# Patient Record
Sex: Male | Born: 1935 | Race: Black or African American | Hispanic: No | Marital: Married | Smoking: Never smoker
Health system: Southern US, Community
[De-identification: ages and names within clinical notes are randomized; demographics above are authoritative.]

## PROBLEM LIST (undated history)

## (undated) DIAGNOSIS — I10 Essential (primary) hypertension: Secondary | ICD-10-CM

## (undated) DIAGNOSIS — I619 Nontraumatic intracerebral hemorrhage, unspecified: Secondary | ICD-10-CM

## (undated) DIAGNOSIS — I69351 Hemiplegia and hemiparesis following cerebral infarction affecting right dominant side: Secondary | ICD-10-CM

## (undated) DIAGNOSIS — E1159 Type 2 diabetes mellitus with other circulatory complications: Secondary | ICD-10-CM

## (undated) DIAGNOSIS — I69220 Aphasia following other nontraumatic intracranial hemorrhage: Secondary | ICD-10-CM

## (undated) DIAGNOSIS — F801 Expressive language disorder: Secondary | ICD-10-CM

## (undated) HISTORY — DX: Nontraumatic intracerebral hemorrhage, unspecified: I61.9

## (undated) HISTORY — DX: Essential (primary) hypertension: I10

## (undated) HISTORY — DX: Expressive language disorder: F80.1

## (undated) HISTORY — DX: Hemiplegia and hemiparesis following cerebral infarction affecting right dominant side: I69.351

## (undated) HISTORY — DX: Type 2 diabetes mellitus with other circulatory complications: E11.59

## (undated) HISTORY — DX: Aphasia following other nontraumatic intracranial hemorrhage: I69.220

## (undated) HISTORY — PX: OTHER SURGICAL HISTORY: SHX169

---

## 2000-09-29 ENCOUNTER — Ambulatory Visit (HOSPITAL_COMMUNITY): Admission: RE | Admit: 2000-09-29 | Discharge: 2000-09-29 | Payer: Self-pay

## 2000-10-28 ENCOUNTER — Ambulatory Visit (HOSPITAL_COMMUNITY): Admission: RE | Admit: 2000-10-28 | Discharge: 2000-10-28 | Payer: Self-pay

## 2001-01-08 ENCOUNTER — Encounter: Payer: Self-pay | Admitting: Urology

## 2001-01-08 ENCOUNTER — Ambulatory Visit (HOSPITAL_COMMUNITY): Admission: RE | Admit: 2001-01-08 | Discharge: 2001-01-08 | Payer: Self-pay | Admitting: Urology

## 2001-08-03 ENCOUNTER — Ambulatory Visit (HOSPITAL_COMMUNITY): Admission: RE | Admit: 2001-08-03 | Discharge: 2001-08-03 | Payer: Self-pay | Admitting: Urology

## 2001-08-03 ENCOUNTER — Encounter: Payer: Self-pay | Admitting: Urology

## 2003-08-02 ENCOUNTER — Ambulatory Visit (HOSPITAL_COMMUNITY): Admission: RE | Admit: 2003-08-02 | Discharge: 2003-08-02 | Payer: Self-pay | Admitting: General Surgery

## 2008-04-15 ENCOUNTER — Ambulatory Visit (HOSPITAL_COMMUNITY): Admission: RE | Admit: 2008-04-15 | Discharge: 2008-04-15 | Payer: Self-pay | Admitting: Urology

## 2008-11-10 ENCOUNTER — Ambulatory Visit (HOSPITAL_COMMUNITY): Admission: RE | Admit: 2008-11-10 | Discharge: 2008-11-11 | Payer: Self-pay | Admitting: Ophthalmology

## 2008-11-11 ENCOUNTER — Emergency Department (HOSPITAL_COMMUNITY): Admission: EM | Admit: 2008-11-11 | Discharge: 2008-11-11 | Payer: Self-pay | Admitting: Emergency Medicine

## 2008-11-13 ENCOUNTER — Emergency Department (HOSPITAL_COMMUNITY): Admission: EM | Admit: 2008-11-13 | Discharge: 2008-11-13 | Payer: Self-pay | Admitting: Emergency Medicine

## 2009-03-17 ENCOUNTER — Ambulatory Visit (HOSPITAL_COMMUNITY): Admission: RE | Admit: 2009-03-17 | Discharge: 2009-03-17 | Payer: Self-pay | Admitting: General Surgery

## 2010-08-15 LAB — GLUCOSE, CAPILLARY: Glucose-Capillary: 140 mg/dL — ABNORMAL HIGH (ref 70–99)

## 2010-08-15 LAB — BASIC METABOLIC PANEL
BUN: 14 mg/dL (ref 6–23)
CO2: 33 mEq/L — ABNORMAL HIGH (ref 19–32)
Chloride: 103 mEq/L (ref 96–112)
Creatinine, Ser: 1.2 mg/dL (ref 0.4–1.5)
Glucose, Bld: 130 mg/dL — ABNORMAL HIGH (ref 70–99)

## 2010-08-15 LAB — CBC
MCHC: 33.3 g/dL (ref 30.0–36.0)
MCV: 76 fL — ABNORMAL LOW (ref 78.0–100.0)
Platelets: 144 10*3/uL — ABNORMAL LOW (ref 150–400)
RDW: 14.4 % (ref 11.5–15.5)
WBC: 3.4 10*3/uL — ABNORMAL LOW (ref 4.0–10.5)

## 2010-08-19 LAB — BASIC METABOLIC PANEL
BUN: 10 mg/dL (ref 6–23)
Calcium: 9.3 mg/dL (ref 8.4–10.5)
Creatinine, Ser: 1.33 mg/dL (ref 0.4–1.5)
GFR calc non Af Amer: 52 mL/min — ABNORMAL LOW (ref >60)

## 2010-08-19 LAB — CBC
Platelets: 155 10*3/uL (ref 150–400)
RDW: 14.6 % (ref 11.5–15.5)
WBC: 3.3 10*3/uL — ABNORMAL LOW (ref 4.0–10.5)

## 2010-08-20 LAB — URINALYSIS, ROUTINE W REFLEX MICROSCOPIC
Leukocytes, UA: NEGATIVE
Nitrite: NEGATIVE
Specific Gravity, Urine: <1.005 — ABNORMAL LOW (ref 1.005–1.030)
Urobilinogen, UA: 0.2 mg/dL (ref 0.0–1.0)
pH: 7 (ref 5.0–8.0)

## 2010-08-20 LAB — URINE CULTURE: Colony Count: 25000

## 2010-08-20 LAB — URINE MICROSCOPIC-ADD ON

## 2010-09-25 NOTE — Op Note (Signed)
NAME:  Ryan Adams, Ryan Adams                ACCOUNT NO.:  [id number]   MEDICAL RECORD NO.:  [id number]          PATIENT TYPE:  AMB   LOCATION:  SDS                          FACILITY:  [facility]   PHYSICIAN:  John D. Zigmund Daniel, M.D. DATE OF BIRTH:  [birth year]   DATE OF PROCEDURE:  11/10/2008  DATE OF DISCHARGE:                               OPERATIVE REPORT   ADMISSION DIAGNOSIS:  Rhegmatogenous retinal detachment, right eye.   PROCEDURES:  1. Scleral buckle, right eye.  2. Gas fluid exchange, right eye.   SURGEON:  Chrystie Nose. Zigmund Daniel, M.D.   ASSISTANT:  Clancy Gourd, RN.   ANESTHESIA:  General.   DETAILS:  Usual prep and drape, 360-degree limbal peritomy.  Isolation  of 4 rectus muscles on 2-0 silk.  Localization of break at 6 o'clock.  Scleral dissection for 360 degrees to admit #279 intrascleral implant.  Diathermy placed in the bed.  Additional posterior dissection carried  out at 6 o'clock beneath the break.  A 279 implant placed around the  globe with a joint at 2 o'clock.  A 240 band was placed around the globe  with a 270 sleeve at 2 o'clock.  Perforation site was chosen at 10  o'clock.  A large amount of clear colorless subretinal fluid came forth.  Perfluoropropane 50%, 2 mL was injected into the vitreous cavity to  reattach the retina.  Additional fluid egressed at that time.  The  buckle was adjusted and trimmed.  The band was adjusted and trimmed.  The sutures were knotted, and the free ends removed.  Indirect  ophthalmoscopy showed the retina to be lying nicely on the scleral  buckle with no subretinal fluid remaining.  The sutures were knotted and  the free ends removed.  The conjunctiva was reposited with 7-0 chromic  suture.  Polymyxin and gentamicin were irrigated at [place] space.  Atropine solution was applied.  Decadron 10 mg was injected into the  lower subconjunctival space.  Marcaine was injected around the globe for  postop pain.  Closing pressure was 15 with a [place]  tonometer.  Polysporin ophthalmic ointment, a patch, and shield were placed.  The  patient was awakened and taken to the recovery in satisfactory  condition.   DURATION:  2 hours.   COMPLICATIONS:  None.      Chrystie Nose. Zigmund Daniel, M.D.  Electronically Signed     JDM/MEDQ  D:  11/10/2008  T:  11/11/2008  Job:  [id number]

## 2010-09-28 NOTE — H&P (Signed)
NAME:  Whitley, Zarling NO.:  [id number]   MEDICAL RECORD NO.:  [id number]                  PATIENT TYPE:   LOCATION:                                       FACILITY:   PHYSICIAN:  Jamesetta So, M.D.               DATE OF BIRTH:  10-27-1927   DATE OF ADMISSION:  08/02/2003  DATE OF DISCHARGE:                                HISTORY & PHYSICAL   CHIEF COMPLAINT:  Hematochezia.   HISTORY OF PRESENT ILLNESS:  The patient is a 77 year old black male who is  referred for endoscopic evaluation.  He needs a colonoscopy for  hematochezia.  He last had a colonoscopy over 10 years ago.  There is no  family history of colon carcinoma.  He has had intermittent hematochezia  with bowel movements.  No history of abdominal pain, weight loss, nausea,  vomiting, diarrhea, or melena have been noted.   PAST MEDICAL HISTORY:  Includes hypertension.   PAST SURGICAL HISTORY:  Left inguinal herniorrhaphy in 2001.   CURRENT MEDICATIONS:  A blood pressure pill, nerve pill, baby aspirin,  Prevacid, a fluid pill.   ALLERGIES:  No known drug allergies.   REVIEW OF SYSTEMS:  Noncontributory.   PHYSICAL EXAMINATION:  GENERAL:  The patient is a well-developed, well-  nourished black male in no acute distress.  VITAL SIGNS:  He is afebrile and vital signs are stable.  LUNGS:  Clear to auscultation with equal breath sounds bilaterally.  HEART:  Reveals a regular rate and rhythm without S3, S4, or murmurs.  ABDOMEN:  Soft, nontender, nondistended.  No hepatosplenomegaly or masses  are noted.  RECTAL:  Deferred to the procedure.   IMPRESSION:  Hematochezia.   PLAN:  The patient is scheduled for a colonoscopy on August 02, 2003.  The  risks and benefits of the procedure including bleeding and perforation were  fully explained to the patient who gave informed consent.     ___________________________________________                                         Jamesetta So,  M.D.   MAJ/MEDQ  D:  07/19/2003  T:  07/19/2003  Job:  [id number]   cc:   Monico Blitz, MD  [place]  [place] [address]  Fax: [phone]

## 2014-08-19 ENCOUNTER — Encounter (INDEPENDENT_AMBULATORY_CARE_PROVIDER_SITE_OTHER): Payer: Medicare Other | Admitting: Ophthalmology

## 2014-08-19 DIAGNOSIS — H338 Other retinal detachments: Secondary | ICD-10-CM

## 2014-08-19 DIAGNOSIS — E11329 Type 2 diabetes mellitus with mild nonproliferative diabetic retinopathy without macular edema: Secondary | ICD-10-CM

## 2014-08-19 DIAGNOSIS — I10 Essential (primary) hypertension: Secondary | ICD-10-CM

## 2014-08-19 DIAGNOSIS — H35033 Hypertensive retinopathy, bilateral: Secondary | ICD-10-CM

## 2014-08-19 DIAGNOSIS — H43813 Vitreous degeneration, bilateral: Secondary | ICD-10-CM

## 2014-08-19 DIAGNOSIS — E11319 Type 2 diabetes mellitus with unspecified diabetic retinopathy without macular edema: Secondary | ICD-10-CM

## 2014-08-22 NOTE — Progress Notes (Signed)
Spoke with both pt and Dr. Zigmund Daniel, pt surgery is cancelled.

## 2014-08-23 ENCOUNTER — Encounter (HOSPITAL_COMMUNITY): Admission: RE | Payer: Self-pay | Source: Ambulatory Visit

## 2014-08-23 ENCOUNTER — Ambulatory Visit (HOSPITAL_COMMUNITY): Admission: RE | Admit: 2014-08-23 | Payer: Self-pay | Source: Ambulatory Visit | Admitting: Ophthalmology

## 2014-08-23 SURGERY — SCLERAL BUCKLE
Anesthesia: General | Laterality: Right

## 2015-07-06 DIAGNOSIS — E1165 Type 2 diabetes mellitus with hyperglycemia: Secondary | ICD-10-CM | POA: Diagnosis not present

## 2015-07-06 DIAGNOSIS — I10 Essential (primary) hypertension: Secondary | ICD-10-CM | POA: Diagnosis not present

## 2015-07-06 DIAGNOSIS — Z0000 Encounter for general adult medical examination without abnormal findings: Secondary | ICD-10-CM | POA: Diagnosis not present

## 2015-10-03 DIAGNOSIS — E1165 Type 2 diabetes mellitus with hyperglycemia: Secondary | ICD-10-CM | POA: Diagnosis not present

## 2015-10-03 DIAGNOSIS — I10 Essential (primary) hypertension: Secondary | ICD-10-CM | POA: Diagnosis not present

## 2015-10-03 DIAGNOSIS — L600 Ingrowing nail: Secondary | ICD-10-CM | POA: Diagnosis not present

## 2015-11-21 DIAGNOSIS — E119 Type 2 diabetes mellitus without complications: Secondary | ICD-10-CM | POA: Diagnosis not present

## 2016-01-02 DIAGNOSIS — Z681 Body mass index (BMI) 19 or less, adult: Secondary | ICD-10-CM | POA: Diagnosis not present

## 2016-01-02 DIAGNOSIS — E1165 Type 2 diabetes mellitus with hyperglycemia: Secondary | ICD-10-CM | POA: Diagnosis not present

## 2016-01-02 DIAGNOSIS — K210 Gastro-esophageal reflux disease with esophagitis: Secondary | ICD-10-CM | POA: Diagnosis not present

## 2016-01-02 DIAGNOSIS — Z1389 Encounter for screening for other disorder: Secondary | ICD-10-CM | POA: Diagnosis not present

## 2016-01-02 DIAGNOSIS — I10 Essential (primary) hypertension: Secondary | ICD-10-CM | POA: Diagnosis not present

## 2016-01-02 DIAGNOSIS — Z0000 Encounter for general adult medical examination without abnormal findings: Secondary | ICD-10-CM | POA: Diagnosis not present

## 2016-01-02 DIAGNOSIS — N401 Enlarged prostate with lower urinary tract symptoms: Secondary | ICD-10-CM | POA: Diagnosis not present

## 2016-01-16 DIAGNOSIS — E1165 Type 2 diabetes mellitus with hyperglycemia: Secondary | ICD-10-CM | POA: Diagnosis not present

## 2016-01-16 DIAGNOSIS — K210 Gastro-esophageal reflux disease with esophagitis: Secondary | ICD-10-CM | POA: Diagnosis not present

## 2016-01-16 DIAGNOSIS — I10 Essential (primary) hypertension: Secondary | ICD-10-CM | POA: Diagnosis not present

## 2016-02-13 DIAGNOSIS — Z23 Encounter for immunization: Secondary | ICD-10-CM | POA: Diagnosis not present

## 2016-02-14 DIAGNOSIS — M85851 Other specified disorders of bone density and structure, right thigh: Secondary | ICD-10-CM | POA: Diagnosis not present

## 2016-02-14 DIAGNOSIS — Z87891 Personal history of nicotine dependence: Secondary | ICD-10-CM | POA: Diagnosis not present

## 2016-02-14 DIAGNOSIS — Z79899 Other long term (current) drug therapy: Secondary | ICD-10-CM | POA: Diagnosis not present

## 2016-02-14 DIAGNOSIS — M810 Age-related osteoporosis without current pathological fracture: Secondary | ICD-10-CM | POA: Diagnosis not present

## 2016-02-14 DIAGNOSIS — E119 Type 2 diabetes mellitus without complications: Secondary | ICD-10-CM | POA: Diagnosis not present

## 2016-02-14 DIAGNOSIS — K219 Gastro-esophageal reflux disease without esophagitis: Secondary | ICD-10-CM | POA: Diagnosis not present

## 2016-02-14 DIAGNOSIS — M85852 Other specified disorders of bone density and structure, left thigh: Secondary | ICD-10-CM | POA: Diagnosis not present

## 2016-02-14 DIAGNOSIS — M1990 Unspecified osteoarthritis, unspecified site: Secondary | ICD-10-CM | POA: Diagnosis not present

## 2016-02-14 DIAGNOSIS — I10 Essential (primary) hypertension: Secondary | ICD-10-CM | POA: Diagnosis not present

## 2016-02-14 DIAGNOSIS — Z7984 Long term (current) use of oral hypoglycemic drugs: Secondary | ICD-10-CM | POA: Diagnosis not present

## 2016-02-14 DIAGNOSIS — M85862 Other specified disorders of bone density and structure, left lower leg: Secondary | ICD-10-CM | POA: Diagnosis not present

## 2016-03-15 DIAGNOSIS — E1165 Type 2 diabetes mellitus with hyperglycemia: Secondary | ICD-10-CM | POA: Diagnosis not present

## 2016-03-15 DIAGNOSIS — K210 Gastro-esophageal reflux disease with esophagitis: Secondary | ICD-10-CM | POA: Diagnosis not present

## 2016-03-15 DIAGNOSIS — I10 Essential (primary) hypertension: Secondary | ICD-10-CM | POA: Diagnosis not present

## 2016-04-16 DIAGNOSIS — K210 Gastro-esophageal reflux disease with esophagitis: Secondary | ICD-10-CM | POA: Diagnosis not present

## 2016-04-16 DIAGNOSIS — E1165 Type 2 diabetes mellitus with hyperglycemia: Secondary | ICD-10-CM | POA: Diagnosis not present

## 2016-04-16 DIAGNOSIS — I10 Essential (primary) hypertension: Secondary | ICD-10-CM | POA: Diagnosis not present

## 2016-04-16 DIAGNOSIS — N401 Enlarged prostate with lower urinary tract symptoms: Secondary | ICD-10-CM | POA: Diagnosis not present

## 2016-05-28 DIAGNOSIS — K210 Gastro-esophageal reflux disease with esophagitis: Secondary | ICD-10-CM | POA: Diagnosis not present

## 2016-05-28 DIAGNOSIS — I10 Essential (primary) hypertension: Secondary | ICD-10-CM | POA: Diagnosis not present

## 2016-05-28 DIAGNOSIS — N401 Enlarged prostate with lower urinary tract symptoms: Secondary | ICD-10-CM | POA: Diagnosis not present

## 2016-05-28 DIAGNOSIS — E1165 Type 2 diabetes mellitus with hyperglycemia: Secondary | ICD-10-CM | POA: Diagnosis not present

## 2016-07-16 DIAGNOSIS — E1165 Type 2 diabetes mellitus with hyperglycemia: Secondary | ICD-10-CM | POA: Diagnosis not present

## 2016-07-16 DIAGNOSIS — Z0000 Encounter for general adult medical examination without abnormal findings: Secondary | ICD-10-CM | POA: Diagnosis not present

## 2016-07-16 DIAGNOSIS — N401 Enlarged prostate with lower urinary tract symptoms: Secondary | ICD-10-CM | POA: Diagnosis not present

## 2016-07-16 DIAGNOSIS — K210 Gastro-esophageal reflux disease with esophagitis: Secondary | ICD-10-CM | POA: Diagnosis not present

## 2016-07-16 DIAGNOSIS — I10 Essential (primary) hypertension: Secondary | ICD-10-CM | POA: Diagnosis not present

## 2016-10-17 DIAGNOSIS — K210 Gastro-esophageal reflux disease with esophagitis: Secondary | ICD-10-CM | POA: Diagnosis not present

## 2016-10-17 DIAGNOSIS — N401 Enlarged prostate with lower urinary tract symptoms: Secondary | ICD-10-CM | POA: Diagnosis not present

## 2016-10-17 DIAGNOSIS — I10 Essential (primary) hypertension: Secondary | ICD-10-CM | POA: Diagnosis not present

## 2016-10-17 DIAGNOSIS — E1165 Type 2 diabetes mellitus with hyperglycemia: Secondary | ICD-10-CM | POA: Diagnosis not present

## 2017-01-15 DIAGNOSIS — K210 Gastro-esophageal reflux disease with esophagitis: Secondary | ICD-10-CM | POA: Diagnosis not present

## 2017-01-15 DIAGNOSIS — E119 Type 2 diabetes mellitus without complications: Secondary | ICD-10-CM | POA: Diagnosis not present

## 2017-01-15 DIAGNOSIS — I10 Essential (primary) hypertension: Secondary | ICD-10-CM | POA: Diagnosis not present

## 2017-01-15 DIAGNOSIS — N401 Enlarged prostate with lower urinary tract symptoms: Secondary | ICD-10-CM | POA: Diagnosis not present

## 2017-02-04 DIAGNOSIS — K210 Gastro-esophageal reflux disease with esophagitis: Secondary | ICD-10-CM | POA: Diagnosis not present

## 2017-02-04 DIAGNOSIS — I10 Essential (primary) hypertension: Secondary | ICD-10-CM | POA: Diagnosis not present

## 2017-02-04 DIAGNOSIS — E1165 Type 2 diabetes mellitus with hyperglycemia: Secondary | ICD-10-CM | POA: Diagnosis not present

## 2017-02-11 DIAGNOSIS — Z23 Encounter for immunization: Secondary | ICD-10-CM | POA: Diagnosis not present

## 2017-03-11 DIAGNOSIS — E1165 Type 2 diabetes mellitus with hyperglycemia: Secondary | ICD-10-CM | POA: Diagnosis not present

## 2017-03-11 DIAGNOSIS — K210 Gastro-esophageal reflux disease with esophagitis: Secondary | ICD-10-CM | POA: Diagnosis not present

## 2017-03-11 DIAGNOSIS — I10 Essential (primary) hypertension: Secondary | ICD-10-CM | POA: Diagnosis not present

## 2017-04-11 DIAGNOSIS — E1165 Type 2 diabetes mellitus with hyperglycemia: Secondary | ICD-10-CM | POA: Diagnosis not present

## 2017-04-11 DIAGNOSIS — K210 Gastro-esophageal reflux disease with esophagitis: Secondary | ICD-10-CM | POA: Diagnosis not present

## 2017-04-11 DIAGNOSIS — I10 Essential (primary) hypertension: Secondary | ICD-10-CM | POA: Diagnosis not present

## 2017-04-15 DIAGNOSIS — N401 Enlarged prostate with lower urinary tract symptoms: Secondary | ICD-10-CM | POA: Diagnosis not present

## 2017-04-15 DIAGNOSIS — Z125 Encounter for screening for malignant neoplasm of prostate: Secondary | ICD-10-CM | POA: Diagnosis not present

## 2017-04-15 DIAGNOSIS — E119 Type 2 diabetes mellitus without complications: Secondary | ICD-10-CM | POA: Diagnosis not present

## 2017-04-15 DIAGNOSIS — K210 Gastro-esophageal reflux disease with esophagitis: Secondary | ICD-10-CM | POA: Diagnosis not present

## 2017-04-15 DIAGNOSIS — I10 Essential (primary) hypertension: Secondary | ICD-10-CM | POA: Diagnosis not present

## 2017-05-09 DIAGNOSIS — I10 Essential (primary) hypertension: Secondary | ICD-10-CM | POA: Diagnosis not present

## 2017-05-09 DIAGNOSIS — K210 Gastro-esophageal reflux disease with esophagitis: Secondary | ICD-10-CM | POA: Diagnosis not present

## 2017-05-09 DIAGNOSIS — E119 Type 2 diabetes mellitus without complications: Secondary | ICD-10-CM | POA: Diagnosis not present

## 2017-06-04 DIAGNOSIS — K210 Gastro-esophageal reflux disease with esophagitis: Secondary | ICD-10-CM | POA: Diagnosis not present

## 2017-06-04 DIAGNOSIS — E119 Type 2 diabetes mellitus without complications: Secondary | ICD-10-CM | POA: Diagnosis not present

## 2017-06-04 DIAGNOSIS — I10 Essential (primary) hypertension: Secondary | ICD-10-CM | POA: Diagnosis not present

## 2017-06-17 DIAGNOSIS — E119 Type 2 diabetes mellitus without complications: Secondary | ICD-10-CM | POA: Diagnosis not present

## 2017-06-17 DIAGNOSIS — K210 Gastro-esophageal reflux disease with esophagitis: Secondary | ICD-10-CM | POA: Diagnosis not present

## 2017-06-17 DIAGNOSIS — I10 Essential (primary) hypertension: Secondary | ICD-10-CM | POA: Diagnosis not present

## 2017-07-11 DIAGNOSIS — E119 Type 2 diabetes mellitus without complications: Secondary | ICD-10-CM | POA: Diagnosis not present

## 2017-07-11 DIAGNOSIS — K210 Gastro-esophageal reflux disease with esophagitis: Secondary | ICD-10-CM | POA: Diagnosis not present

## 2017-07-11 DIAGNOSIS — I10 Essential (primary) hypertension: Secondary | ICD-10-CM | POA: Diagnosis not present

## 2017-07-14 DIAGNOSIS — K210 Gastro-esophageal reflux disease with esophagitis: Secondary | ICD-10-CM | POA: Diagnosis not present

## 2017-07-14 DIAGNOSIS — E119 Type 2 diabetes mellitus without complications: Secondary | ICD-10-CM | POA: Diagnosis not present

## 2017-07-14 DIAGNOSIS — N401 Enlarged prostate with lower urinary tract symptoms: Secondary | ICD-10-CM | POA: Diagnosis not present

## 2017-07-14 DIAGNOSIS — Z0000 Encounter for general adult medical examination without abnormal findings: Secondary | ICD-10-CM | POA: Diagnosis not present

## 2017-07-14 DIAGNOSIS — I10 Essential (primary) hypertension: Secondary | ICD-10-CM | POA: Diagnosis not present

## 2017-07-14 DIAGNOSIS — Z1389 Encounter for screening for other disorder: Secondary | ICD-10-CM | POA: Diagnosis not present

## 2017-08-15 DIAGNOSIS — K210 Gastro-esophageal reflux disease with esophagitis: Secondary | ICD-10-CM | POA: Diagnosis not present

## 2017-08-15 DIAGNOSIS — I10 Essential (primary) hypertension: Secondary | ICD-10-CM | POA: Diagnosis not present

## 2017-08-15 DIAGNOSIS — E119 Type 2 diabetes mellitus without complications: Secondary | ICD-10-CM | POA: Diagnosis not present

## 2017-09-26 DIAGNOSIS — I10 Essential (primary) hypertension: Secondary | ICD-10-CM | POA: Diagnosis not present

## 2017-09-26 DIAGNOSIS — E119 Type 2 diabetes mellitus without complications: Secondary | ICD-10-CM | POA: Diagnosis not present

## 2017-09-26 DIAGNOSIS — K210 Gastro-esophageal reflux disease with esophagitis: Secondary | ICD-10-CM | POA: Diagnosis not present

## 2017-10-14 DIAGNOSIS — K210 Gastro-esophageal reflux disease with esophagitis: Secondary | ICD-10-CM | POA: Diagnosis not present

## 2017-10-14 DIAGNOSIS — Z0000 Encounter for general adult medical examination without abnormal findings: Secondary | ICD-10-CM | POA: Diagnosis not present

## 2017-10-14 DIAGNOSIS — Z1389 Encounter for screening for other disorder: Secondary | ICD-10-CM | POA: Diagnosis not present

## 2017-10-14 DIAGNOSIS — N401 Enlarged prostate with lower urinary tract symptoms: Secondary | ICD-10-CM | POA: Diagnosis not present

## 2017-10-14 DIAGNOSIS — E119 Type 2 diabetes mellitus without complications: Secondary | ICD-10-CM | POA: Diagnosis not present

## 2017-10-14 DIAGNOSIS — I10 Essential (primary) hypertension: Secondary | ICD-10-CM | POA: Diagnosis not present

## 2017-10-22 DIAGNOSIS — I10 Essential (primary) hypertension: Secondary | ICD-10-CM | POA: Diagnosis not present

## 2017-10-22 DIAGNOSIS — K210 Gastro-esophageal reflux disease with esophagitis: Secondary | ICD-10-CM | POA: Diagnosis not present

## 2017-10-22 DIAGNOSIS — E119 Type 2 diabetes mellitus without complications: Secondary | ICD-10-CM | POA: Diagnosis not present

## 2017-12-25 DIAGNOSIS — K210 Gastro-esophageal reflux disease with esophagitis: Secondary | ICD-10-CM | POA: Diagnosis not present

## 2017-12-25 DIAGNOSIS — I10 Essential (primary) hypertension: Secondary | ICD-10-CM | POA: Diagnosis not present

## 2017-12-25 DIAGNOSIS — E119 Type 2 diabetes mellitus without complications: Secondary | ICD-10-CM | POA: Diagnosis not present

## 2018-01-14 DIAGNOSIS — K210 Gastro-esophageal reflux disease with esophagitis: Secondary | ICD-10-CM | POA: Diagnosis not present

## 2018-01-14 DIAGNOSIS — E119 Type 2 diabetes mellitus without complications: Secondary | ICD-10-CM | POA: Diagnosis not present

## 2018-01-14 DIAGNOSIS — I10 Essential (primary) hypertension: Secondary | ICD-10-CM | POA: Diagnosis not present

## 2018-01-22 DIAGNOSIS — K210 Gastro-esophageal reflux disease with esophagitis: Secondary | ICD-10-CM | POA: Diagnosis not present

## 2018-01-22 DIAGNOSIS — I10 Essential (primary) hypertension: Secondary | ICD-10-CM | POA: Diagnosis not present

## 2018-01-22 DIAGNOSIS — E119 Type 2 diabetes mellitus without complications: Secondary | ICD-10-CM | POA: Diagnosis not present

## 2018-02-26 DIAGNOSIS — I10 Essential (primary) hypertension: Secondary | ICD-10-CM | POA: Diagnosis not present

## 2018-02-26 DIAGNOSIS — K210 Gastro-esophageal reflux disease with esophagitis: Secondary | ICD-10-CM | POA: Diagnosis not present

## 2018-02-26 DIAGNOSIS — E119 Type 2 diabetes mellitus without complications: Secondary | ICD-10-CM | POA: Diagnosis not present

## 2018-04-07 DIAGNOSIS — I10 Essential (primary) hypertension: Secondary | ICD-10-CM | POA: Diagnosis not present

## 2018-04-07 DIAGNOSIS — E119 Type 2 diabetes mellitus without complications: Secondary | ICD-10-CM | POA: Diagnosis not present

## 2018-04-07 DIAGNOSIS — K210 Gastro-esophageal reflux disease with esophagitis: Secondary | ICD-10-CM | POA: Diagnosis not present

## 2018-04-15 DIAGNOSIS — I10 Essential (primary) hypertension: Secondary | ICD-10-CM | POA: Diagnosis not present

## 2018-04-15 DIAGNOSIS — E119 Type 2 diabetes mellitus without complications: Secondary | ICD-10-CM | POA: Diagnosis not present

## 2018-04-15 DIAGNOSIS — K210 Gastro-esophageal reflux disease with esophagitis: Secondary | ICD-10-CM | POA: Diagnosis not present

## 2018-05-11 DIAGNOSIS — E119 Type 2 diabetes mellitus without complications: Secondary | ICD-10-CM | POA: Diagnosis not present

## 2018-05-11 DIAGNOSIS — K210 Gastro-esophageal reflux disease with esophagitis: Secondary | ICD-10-CM | POA: Diagnosis not present

## 2018-05-11 DIAGNOSIS — I10 Essential (primary) hypertension: Secondary | ICD-10-CM | POA: Diagnosis not present

## 2018-06-08 DIAGNOSIS — I10 Essential (primary) hypertension: Secondary | ICD-10-CM | POA: Diagnosis not present

## 2018-06-08 DIAGNOSIS — E119 Type 2 diabetes mellitus without complications: Secondary | ICD-10-CM | POA: Diagnosis not present

## 2018-06-08 DIAGNOSIS — K210 Gastro-esophageal reflux disease with esophagitis: Secondary | ICD-10-CM | POA: Diagnosis not present

## 2018-07-06 DIAGNOSIS — K210 Gastro-esophageal reflux disease with esophagitis: Secondary | ICD-10-CM | POA: Diagnosis not present

## 2018-07-06 DIAGNOSIS — I10 Essential (primary) hypertension: Secondary | ICD-10-CM | POA: Diagnosis not present

## 2018-07-06 DIAGNOSIS — E119 Type 2 diabetes mellitus without complications: Secondary | ICD-10-CM | POA: Diagnosis not present

## 2018-07-09 DIAGNOSIS — I739 Peripheral vascular disease, unspecified: Secondary | ICD-10-CM | POA: Diagnosis not present

## 2018-07-10 DIAGNOSIS — I739 Peripheral vascular disease, unspecified: Secondary | ICD-10-CM | POA: Diagnosis not present

## 2018-07-15 DIAGNOSIS — I10 Essential (primary) hypertension: Secondary | ICD-10-CM | POA: Diagnosis not present

## 2018-07-15 DIAGNOSIS — Z0000 Encounter for general adult medical examination without abnormal findings: Secondary | ICD-10-CM | POA: Diagnosis not present

## 2018-07-15 DIAGNOSIS — K210 Gastro-esophageal reflux disease with esophagitis: Secondary | ICD-10-CM | POA: Diagnosis not present

## 2018-07-15 DIAGNOSIS — E119 Type 2 diabetes mellitus without complications: Secondary | ICD-10-CM | POA: Diagnosis not present

## 2018-07-15 DIAGNOSIS — Z1389 Encounter for screening for other disorder: Secondary | ICD-10-CM | POA: Diagnosis not present

## 2018-08-04 DIAGNOSIS — E119 Type 2 diabetes mellitus without complications: Secondary | ICD-10-CM | POA: Diagnosis not present

## 2018-08-04 DIAGNOSIS — I10 Essential (primary) hypertension: Secondary | ICD-10-CM | POA: Diagnosis not present

## 2018-08-04 DIAGNOSIS — K210 Gastro-esophageal reflux disease with esophagitis: Secondary | ICD-10-CM | POA: Diagnosis not present

## 2018-09-01 DIAGNOSIS — K210 Gastro-esophageal reflux disease with esophagitis: Secondary | ICD-10-CM | POA: Diagnosis not present

## 2018-09-01 DIAGNOSIS — I10 Essential (primary) hypertension: Secondary | ICD-10-CM | POA: Diagnosis not present

## 2018-09-01 DIAGNOSIS — E119 Type 2 diabetes mellitus without complications: Secondary | ICD-10-CM | POA: Diagnosis not present

## 2018-09-28 DIAGNOSIS — K210 Gastro-esophageal reflux disease with esophagitis: Secondary | ICD-10-CM | POA: Diagnosis not present

## 2018-09-28 DIAGNOSIS — I10 Essential (primary) hypertension: Secondary | ICD-10-CM | POA: Diagnosis not present

## 2018-09-28 DIAGNOSIS — E119 Type 2 diabetes mellitus without complications: Secondary | ICD-10-CM | POA: Diagnosis not present

## 2018-10-27 DIAGNOSIS — I10 Essential (primary) hypertension: Secondary | ICD-10-CM | POA: Diagnosis not present

## 2018-10-27 DIAGNOSIS — K210 Gastro-esophageal reflux disease with esophagitis: Secondary | ICD-10-CM | POA: Diagnosis not present

## 2018-10-27 DIAGNOSIS — E119 Type 2 diabetes mellitus without complications: Secondary | ICD-10-CM | POA: Diagnosis not present

## 2018-11-30 DIAGNOSIS — I10 Essential (primary) hypertension: Secondary | ICD-10-CM | POA: Diagnosis not present

## 2018-11-30 DIAGNOSIS — K210 Gastro-esophageal reflux disease with esophagitis: Secondary | ICD-10-CM | POA: Diagnosis not present

## 2018-11-30 DIAGNOSIS — E119 Type 2 diabetes mellitus without complications: Secondary | ICD-10-CM | POA: Diagnosis not present

## 2018-11-30 DIAGNOSIS — Z0000 Encounter for general adult medical examination without abnormal findings: Secondary | ICD-10-CM | POA: Diagnosis not present

## 2018-12-04 DIAGNOSIS — I10 Essential (primary) hypertension: Secondary | ICD-10-CM | POA: Diagnosis not present

## 2018-12-04 DIAGNOSIS — K210 Gastro-esophageal reflux disease with esophagitis: Secondary | ICD-10-CM | POA: Diagnosis not present

## 2018-12-04 DIAGNOSIS — E119 Type 2 diabetes mellitus without complications: Secondary | ICD-10-CM | POA: Diagnosis not present

## 2019-03-01 DIAGNOSIS — K219 Gastro-esophageal reflux disease without esophagitis: Secondary | ICD-10-CM | POA: Diagnosis not present

## 2019-03-01 DIAGNOSIS — I10 Essential (primary) hypertension: Secondary | ICD-10-CM | POA: Diagnosis not present

## 2019-03-01 DIAGNOSIS — E119 Type 2 diabetes mellitus without complications: Secondary | ICD-10-CM | POA: Diagnosis not present

## 2019-03-02 DIAGNOSIS — E119 Type 2 diabetes mellitus without complications: Secondary | ICD-10-CM | POA: Diagnosis not present

## 2019-03-02 DIAGNOSIS — I10 Essential (primary) hypertension: Secondary | ICD-10-CM | POA: Diagnosis not present

## 2019-03-02 DIAGNOSIS — K219 Gastro-esophageal reflux disease without esophagitis: Secondary | ICD-10-CM | POA: Diagnosis not present

## 2019-03-05 DIAGNOSIS — I10 Essential (primary) hypertension: Secondary | ICD-10-CM | POA: Diagnosis not present

## 2019-03-05 DIAGNOSIS — E119 Type 2 diabetes mellitus without complications: Secondary | ICD-10-CM | POA: Diagnosis not present

## 2019-03-30 DIAGNOSIS — I10 Essential (primary) hypertension: Secondary | ICD-10-CM | POA: Diagnosis not present

## 2019-03-30 DIAGNOSIS — E119 Type 2 diabetes mellitus without complications: Secondary | ICD-10-CM | POA: Diagnosis not present

## 2019-05-26 DIAGNOSIS — E119 Type 2 diabetes mellitus without complications: Secondary | ICD-10-CM | POA: Diagnosis not present

## 2019-05-26 DIAGNOSIS — I10 Essential (primary) hypertension: Secondary | ICD-10-CM | POA: Diagnosis not present

## 2019-06-02 DIAGNOSIS — K2100 Gastro-esophageal reflux disease with esophagitis, without bleeding: Secondary | ICD-10-CM | POA: Diagnosis not present

## 2019-06-02 DIAGNOSIS — I10 Essential (primary) hypertension: Secondary | ICD-10-CM | POA: Diagnosis not present

## 2019-06-02 DIAGNOSIS — Z0000 Encounter for general adult medical examination without abnormal findings: Secondary | ICD-10-CM | POA: Diagnosis not present

## 2019-06-02 DIAGNOSIS — E119 Type 2 diabetes mellitus without complications: Secondary | ICD-10-CM | POA: Diagnosis not present

## 2019-06-02 DIAGNOSIS — Z1389 Encounter for screening for other disorder: Secondary | ICD-10-CM | POA: Diagnosis not present

## 2019-07-09 DIAGNOSIS — E119 Type 2 diabetes mellitus without complications: Secondary | ICD-10-CM | POA: Diagnosis not present

## 2019-07-09 DIAGNOSIS — I10 Essential (primary) hypertension: Secondary | ICD-10-CM | POA: Diagnosis not present

## 2019-07-09 DIAGNOSIS — K2100 Gastro-esophageal reflux disease with esophagitis, without bleeding: Secondary | ICD-10-CM | POA: Diagnosis not present

## 2019-07-22 DIAGNOSIS — K2100 Gastro-esophageal reflux disease with esophagitis, without bleeding: Secondary | ICD-10-CM | POA: Diagnosis not present

## 2019-07-22 DIAGNOSIS — I10 Essential (primary) hypertension: Secondary | ICD-10-CM | POA: Diagnosis not present

## 2019-07-22 DIAGNOSIS — E119 Type 2 diabetes mellitus without complications: Secondary | ICD-10-CM | POA: Diagnosis not present

## 2019-08-16 DIAGNOSIS — I10 Essential (primary) hypertension: Secondary | ICD-10-CM | POA: Diagnosis not present

## 2019-08-16 DIAGNOSIS — E119 Type 2 diabetes mellitus without complications: Secondary | ICD-10-CM | POA: Diagnosis not present

## 2019-08-16 DIAGNOSIS — K2100 Gastro-esophageal reflux disease with esophagitis, without bleeding: Secondary | ICD-10-CM | POA: Diagnosis not present

## 2019-09-01 DIAGNOSIS — I10 Essential (primary) hypertension: Secondary | ICD-10-CM | POA: Diagnosis not present

## 2019-09-01 DIAGNOSIS — Z0000 Encounter for general adult medical examination without abnormal findings: Secondary | ICD-10-CM | POA: Diagnosis not present

## 2019-09-01 DIAGNOSIS — Z1389 Encounter for screening for other disorder: Secondary | ICD-10-CM | POA: Diagnosis not present

## 2019-09-01 DIAGNOSIS — K2100 Gastro-esophageal reflux disease with esophagitis, without bleeding: Secondary | ICD-10-CM | POA: Diagnosis not present

## 2019-09-01 DIAGNOSIS — E119 Type 2 diabetes mellitus without complications: Secondary | ICD-10-CM | POA: Diagnosis not present

## 2019-09-13 DIAGNOSIS — I10 Essential (primary) hypertension: Secondary | ICD-10-CM | POA: Diagnosis not present

## 2019-09-13 DIAGNOSIS — E119 Type 2 diabetes mellitus without complications: Secondary | ICD-10-CM | POA: Diagnosis not present

## 2019-09-13 DIAGNOSIS — K2100 Gastro-esophageal reflux disease with esophagitis, without bleeding: Secondary | ICD-10-CM | POA: Diagnosis not present

## 2019-10-27 DIAGNOSIS — E119 Type 2 diabetes mellitus without complications: Secondary | ICD-10-CM | POA: Diagnosis not present

## 2019-10-27 DIAGNOSIS — K2100 Gastro-esophageal reflux disease with esophagitis, without bleeding: Secondary | ICD-10-CM | POA: Diagnosis not present

## 2019-10-27 DIAGNOSIS — I10 Essential (primary) hypertension: Secondary | ICD-10-CM | POA: Diagnosis not present

## 2019-12-01 DIAGNOSIS — I10 Essential (primary) hypertension: Secondary | ICD-10-CM | POA: Diagnosis not present

## 2019-12-01 DIAGNOSIS — K2100 Gastro-esophageal reflux disease with esophagitis, without bleeding: Secondary | ICD-10-CM | POA: Diagnosis not present

## 2019-12-01 DIAGNOSIS — E119 Type 2 diabetes mellitus without complications: Secondary | ICD-10-CM | POA: Diagnosis not present

## 2019-12-09 DIAGNOSIS — E119 Type 2 diabetes mellitus without complications: Secondary | ICD-10-CM | POA: Diagnosis not present

## 2019-12-09 DIAGNOSIS — K2100 Gastro-esophageal reflux disease with esophagitis, without bleeding: Secondary | ICD-10-CM | POA: Diagnosis not present

## 2019-12-09 DIAGNOSIS — I10 Essential (primary) hypertension: Secondary | ICD-10-CM | POA: Diagnosis not present

## 2020-01-06 DIAGNOSIS — E119 Type 2 diabetes mellitus without complications: Secondary | ICD-10-CM | POA: Diagnosis not present

## 2020-01-06 DIAGNOSIS — K2100 Gastro-esophageal reflux disease with esophagitis, without bleeding: Secondary | ICD-10-CM | POA: Diagnosis not present

## 2020-01-06 DIAGNOSIS — I10 Essential (primary) hypertension: Secondary | ICD-10-CM | POA: Diagnosis not present

## 2020-01-13 DIAGNOSIS — I10 Essential (primary) hypertension: Secondary | ICD-10-CM | POA: Diagnosis not present

## 2020-01-13 DIAGNOSIS — K2100 Gastro-esophageal reflux disease with esophagitis, without bleeding: Secondary | ICD-10-CM | POA: Diagnosis not present

## 2020-01-13 DIAGNOSIS — E119 Type 2 diabetes mellitus without complications: Secondary | ICD-10-CM | POA: Diagnosis not present

## 2020-03-02 DIAGNOSIS — Z681 Body mass index (BMI) 19 or less, adult: Secondary | ICD-10-CM | POA: Diagnosis not present

## 2020-03-02 DIAGNOSIS — E1169 Type 2 diabetes mellitus with other specified complication: Secondary | ICD-10-CM | POA: Diagnosis not present

## 2020-03-02 DIAGNOSIS — K2100 Gastro-esophageal reflux disease with esophagitis, without bleeding: Secondary | ICD-10-CM | POA: Diagnosis not present

## 2020-03-02 DIAGNOSIS — I10 Essential (primary) hypertension: Secondary | ICD-10-CM | POA: Diagnosis not present

## 2020-03-07 DIAGNOSIS — E1169 Type 2 diabetes mellitus with other specified complication: Secondary | ICD-10-CM | POA: Diagnosis not present

## 2020-03-07 DIAGNOSIS — I10 Essential (primary) hypertension: Secondary | ICD-10-CM | POA: Diagnosis not present

## 2020-03-27 ENCOUNTER — Inpatient Hospital Stay (HOSPITAL_COMMUNITY)
Admission: EM | Admit: 2020-03-27 | Discharge: 2020-04-03 | DRG: 065 | Disposition: A | Discharge status: Skilled Nursing Facility | Payer: Medicare Other | Attending: Internal Medicine | Admitting: Internal Medicine

## 2020-03-27 ENCOUNTER — Emergency Department (HOSPITAL_COMMUNITY): Payer: Medicare Other

## 2020-03-27 DIAGNOSIS — I10 Essential (primary) hypertension: Secondary | ICD-10-CM | POA: Diagnosis present

## 2020-03-27 DIAGNOSIS — Z20822 Contact with and (suspected) exposure to covid-19: Secondary | ICD-10-CM | POA: Diagnosis present

## 2020-03-27 DIAGNOSIS — I6389 Other cerebral infarction: Secondary | ICD-10-CM | POA: Diagnosis not present

## 2020-03-27 DIAGNOSIS — R451 Restlessness and agitation: Secondary | ICD-10-CM | POA: Diagnosis present

## 2020-03-27 DIAGNOSIS — R0689 Other abnormalities of breathing: Secondary | ICD-10-CM | POA: Diagnosis not present

## 2020-03-27 DIAGNOSIS — R6889 Other general symptoms and signs: Secondary | ICD-10-CM | POA: Diagnosis not present

## 2020-03-27 DIAGNOSIS — S06360A Traumatic hemorrhage of cerebrum, unspecified, without loss of consciousness, initial encounter: Secondary | ICD-10-CM | POA: Diagnosis not present

## 2020-03-27 DIAGNOSIS — R471 Dysarthria and anarthria: Secondary | ICD-10-CM | POA: Diagnosis not present

## 2020-03-27 DIAGNOSIS — I616 Nontraumatic intracerebral hemorrhage, multiple localized: Secondary | ICD-10-CM | POA: Diagnosis not present

## 2020-03-27 DIAGNOSIS — R58 Hemorrhage, not elsewhere classified: Secondary | ICD-10-CM | POA: Diagnosis not present

## 2020-03-27 DIAGNOSIS — I619 Nontraumatic intracerebral hemorrhage, unspecified: Secondary | ICD-10-CM | POA: Diagnosis present

## 2020-03-27 DIAGNOSIS — G8191 Hemiplegia, unspecified affecting right dominant side: Secondary | ICD-10-CM | POA: Diagnosis present

## 2020-03-27 DIAGNOSIS — E785 Hyperlipidemia, unspecified: Secondary | ICD-10-CM | POA: Diagnosis present

## 2020-03-27 DIAGNOSIS — Z7984 Long term (current) use of oral hypoglycemic drugs: Secondary | ICD-10-CM

## 2020-03-27 DIAGNOSIS — I161 Hypertensive emergency: Secondary | ICD-10-CM | POA: Diagnosis present

## 2020-03-27 DIAGNOSIS — Z79899 Other long term (current) drug therapy: Secondary | ICD-10-CM

## 2020-03-27 DIAGNOSIS — I700 Atherosclerosis of aorta: Secondary | ICD-10-CM | POA: Diagnosis not present

## 2020-03-27 DIAGNOSIS — E876 Hypokalemia: Secondary | ICD-10-CM | POA: Diagnosis not present

## 2020-03-27 DIAGNOSIS — I629 Nontraumatic intracranial hemorrhage, unspecified: Secondary | ICD-10-CM

## 2020-03-27 DIAGNOSIS — R404 Transient alteration of awareness: Secondary | ICD-10-CM | POA: Diagnosis not present

## 2020-03-27 DIAGNOSIS — D509 Iron deficiency anemia, unspecified: Secondary | ICD-10-CM | POA: Diagnosis not present

## 2020-03-27 DIAGNOSIS — R4182 Altered mental status, unspecified: Secondary | ICD-10-CM | POA: Diagnosis not present

## 2020-03-27 DIAGNOSIS — N179 Acute kidney failure, unspecified: Secondary | ICD-10-CM | POA: Diagnosis not present

## 2020-03-27 DIAGNOSIS — R636 Underweight: Secondary | ICD-10-CM | POA: Diagnosis present

## 2020-03-27 DIAGNOSIS — E875 Hyperkalemia: Secondary | ICD-10-CM | POA: Diagnosis not present

## 2020-03-27 DIAGNOSIS — I611 Nontraumatic intracerebral hemorrhage in hemisphere, cortical: Secondary | ICD-10-CM | POA: Diagnosis not present

## 2020-03-27 DIAGNOSIS — R4701 Aphasia: Secondary | ICD-10-CM | POA: Diagnosis not present

## 2020-03-27 DIAGNOSIS — Z741 Need for assistance with personal care: Secondary | ICD-10-CM | POA: Diagnosis not present

## 2020-03-27 DIAGNOSIS — M2550 Pain in unspecified joint: Secondary | ICD-10-CM | POA: Diagnosis not present

## 2020-03-27 DIAGNOSIS — K219 Gastro-esophageal reflux disease without esophagitis: Secondary | ICD-10-CM | POA: Diagnosis not present

## 2020-03-27 DIAGNOSIS — R262 Difficulty in walking, not elsewhere classified: Secondary | ICD-10-CM | POA: Diagnosis not present

## 2020-03-27 DIAGNOSIS — I771 Stricture of artery: Secondary | ICD-10-CM | POA: Diagnosis not present

## 2020-03-27 DIAGNOSIS — Z743 Need for continuous supervision: Secondary | ICD-10-CM | POA: Diagnosis not present

## 2020-03-27 DIAGNOSIS — I674 Hypertensive encephalopathy: Secondary | ICD-10-CM | POA: Diagnosis not present

## 2020-03-27 DIAGNOSIS — R1310 Dysphagia, unspecified: Secondary | ICD-10-CM | POA: Diagnosis present

## 2020-03-27 DIAGNOSIS — F801 Expressive language disorder: Secondary | ICD-10-CM | POA: Diagnosis not present

## 2020-03-27 DIAGNOSIS — G936 Cerebral edema: Secondary | ICD-10-CM | POA: Diagnosis not present

## 2020-03-27 DIAGNOSIS — I6523 Occlusion and stenosis of bilateral carotid arteries: Secondary | ICD-10-CM | POA: Diagnosis not present

## 2020-03-27 DIAGNOSIS — G319 Degenerative disease of nervous system, unspecified: Secondary | ICD-10-CM | POA: Diagnosis not present

## 2020-03-27 DIAGNOSIS — Z681 Body mass index (BMI) 19 or less, adult: Secondary | ICD-10-CM

## 2020-03-27 DIAGNOSIS — M6281 Muscle weakness (generalized): Secondary | ICD-10-CM | POA: Diagnosis not present

## 2020-03-27 DIAGNOSIS — E1165 Type 2 diabetes mellitus with hyperglycemia: Secondary | ICD-10-CM | POA: Diagnosis not present

## 2020-03-27 DIAGNOSIS — I618 Other nontraumatic intracerebral hemorrhage: Secondary | ICD-10-CM | POA: Diagnosis not present

## 2020-03-27 DIAGNOSIS — I361 Nonrheumatic tricuspid (valve) insufficiency: Secondary | ICD-10-CM | POA: Diagnosis not present

## 2020-03-27 DIAGNOSIS — I69220 Aphasia following other nontraumatic intracranial hemorrhage: Secondary | ICD-10-CM | POA: Diagnosis not present

## 2020-03-27 DIAGNOSIS — E871 Hypo-osmolality and hyponatremia: Secondary | ICD-10-CM | POA: Diagnosis present

## 2020-03-27 DIAGNOSIS — Z7401 Bed confinement status: Secondary | ICD-10-CM | POA: Diagnosis not present

## 2020-03-27 DIAGNOSIS — G9389 Other specified disorders of brain: Secondary | ICD-10-CM | POA: Diagnosis not present

## 2020-03-27 DIAGNOSIS — E1159 Type 2 diabetes mellitus with other circulatory complications: Secondary | ICD-10-CM | POA: Diagnosis not present

## 2020-03-27 DIAGNOSIS — R410 Disorientation, unspecified: Secondary | ICD-10-CM | POA: Diagnosis not present

## 2020-03-27 DIAGNOSIS — R6339 Other feeding difficulties: Secondary | ICD-10-CM | POA: Diagnosis not present

## 2020-03-27 DIAGNOSIS — I69351 Hemiplegia and hemiparesis following cerebral infarction affecting right dominant side: Secondary | ICD-10-CM | POA: Diagnosis not present

## 2020-03-27 LAB — CBC
HCT: 32.5 % — ABNORMAL LOW (ref 39.0–52.0)
Hemoglobin: 10.3 g/dL — ABNORMAL LOW (ref 13.0–17.0)
MCH: 24.8 pg — ABNORMAL LOW (ref 26.0–34.0)
MCHC: 31.7 g/dL (ref 30.0–36.0)
MCV: 78.1 fL — ABNORMAL LOW (ref 80.0–100.0)
Platelets: 190 10*3/uL (ref 150–400)
RBC: 4.16 MIL/uL — ABNORMAL LOW (ref 4.22–5.81)
RDW: 15.3 % (ref 11.5–15.5)
WBC: 6.7 10*3/uL (ref 4.0–10.5)
nRBC: 0 % (ref 0.0–0.2)

## 2020-03-27 LAB — COMPREHENSIVE METABOLIC PANEL
ALT: 16 U/L (ref 0–44)
AST: 31 U/L (ref 15–41)
Albumin: 3.9 g/dL (ref 3.5–5.0)
Alkaline Phosphatase: 46 U/L (ref 38–126)
Anion gap: 10 (ref 5–15)
BUN: 22 mg/dL (ref 8–23)
CO2: 25 mmol/L (ref 22–32)
Calcium: 8.9 mg/dL (ref 8.9–10.3)
Chloride: 97 mmol/L — ABNORMAL LOW (ref 98–111)
Creatinine, Ser: 1.29 mg/dL — ABNORMAL HIGH (ref 0.61–1.24)
GFR, Estimated: 53 mL/min — ABNORMAL LOW (ref >60)
Glucose, Bld: 104 mg/dL — ABNORMAL HIGH (ref 70–99)
Potassium: 3.9 mmol/L (ref 3.5–5.1)
Sodium: 132 mmol/L — ABNORMAL LOW (ref 135–145)
Total Bilirubin: 0.8 mg/dL (ref 0.3–1.2)
Total Protein: 6.3 g/dL — ABNORMAL LOW (ref 6.5–8.1)

## 2020-03-27 LAB — URINALYSIS, ROUTINE W REFLEX MICROSCOPIC
Bacteria, UA: NONE SEEN
Bilirubin Urine: NEGATIVE
Glucose, UA: NEGATIVE mg/dL
Hgb urine dipstick: NEGATIVE
Ketones, ur: 5 mg/dL — AB
Leukocytes,Ua: NEGATIVE
Nitrite: NEGATIVE
Protein, ur: 30 mg/dL — AB
Specific Gravity, Urine: 1.012 (ref 1.005–1.030)
pH: 7 (ref 5.0–8.0)

## 2020-03-27 LAB — CBG MONITORING, ED: Glucose-Capillary: 89 mg/dL (ref 70–99)

## 2020-03-27 LAB — RAPID URINE DRUG SCREEN, HOSP PERFORMED
Amphetamines: NOT DETECTED
Barbiturates: NOT DETECTED
Benzodiazepines: NOT DETECTED
Cocaine: NOT DETECTED
Opiates: NOT DETECTED
Tetrahydrocannabinol: NOT DETECTED

## 2020-03-27 LAB — RESP PANEL BY RT PCR (RSV, FLU A&B, COVID)
Influenza A by PCR: NEGATIVE
Influenza B by PCR: NEGATIVE
Respiratory Syncytial Virus by PCR: NEGATIVE
SARS Coronavirus 2 by RT PCR: NEGATIVE

## 2020-03-27 LAB — ETHANOL: Alcohol, Ethyl (B): <10 mg/dL (ref <10)

## 2020-03-27 LAB — PROTIME-INR
INR: 1.1 (ref 0.8–1.2)
Prothrombin Time: 13.9 seconds (ref 11.4–15.2)

## 2020-03-27 MED ORDER — CLEVIDIPINE BUTYRATE 0.5 MG/ML IV EMUL
0.0000 mg/h | INTRAVENOUS | Status: DC
  Administered 2020-03-27: 1 mg/h via INTRAVENOUS
  Administered 2020-03-28: 5 mg/h via INTRAVENOUS
  Filled 2020-03-27: qty 50

## 2020-03-27 MED ORDER — SODIUM CHLORIDE 0.9 % IV SOLN: INTRAVENOUS | Status: DC

## 2020-03-27 MED ORDER — SODIUM CHLORIDE 0.9 % IV SOLN: 100.0000 mL/h | INTRAVENOUS | Status: DC

## 2020-03-27 MED ORDER — LABETALOL HCL 5 MG/ML IV SOLN
10.0000 mg | Freq: Once | INTRAVENOUS | Status: AC
  Administered 2020-03-27: 10 mg via INTRAVENOUS
  Filled 2020-03-27: qty 4

## 2020-03-27 MED ORDER — SODIUM CHLORIDE 0.9 % IV BOLUS: 500.0000 mL | Freq: Once | INTRAVENOUS | Status: DC

## 2020-03-27 NOTE — ED Notes (Signed)
ED Provider at bedside. 

## 2020-03-27 NOTE — ED Provider Notes (Signed)
[facility] EMERGENCY DEPARTMENT Provider Note   CSN: [id number] Arrival date & time: 03/27/20  1837     History Chief Complaint  Patient presents with   Altered Mental Status    Ryan Adams is a 86 y.o. male.  HPI Patient presents from home via EMS with family concern of confusion. The patient himself offers repetitive answers to questions, but is sitting upright, smiling.  He is obviously confused, cannot provide details of his own history, level 5 caveat. Per report, patient was well until 2 days ago.  Now since that time he has been less interactive, more confused than usual.    No past medical history on file.  Patient Active Problem List   Diagnosis Date Noted   ICH (intracerebral hemorrhage) ([place]) 03/27/2020    Patient incapable of providing his own past surgical, past medical, social history, level 5 caveat. No family history on file.   Home Medications Prior to Admission medications   Medication Sig Start Date End Date Taking? Authorizing Provider  amLODipine (NORVASC) 5 MG tablet Take 5 mg by mouth daily. 01/10/20  Yes [provider]  benazepril (LOTENSIN) 20 MG tablet Take 20 mg by mouth daily. 02/14/20  Yes [provider]  fluticasone (FLONASE) 50 MCG/ACT nasal spray Place into both nostrils. 01/12/20  Yes [provider]  metFORMIN (GLUCOPHAGE) 1000 MG tablet Take 1,000 mg by mouth 2 (two) times daily. 03/07/20  Yes [provider]  metoprolol tartrate (LOPRESSOR) 50 MG tablet Take 50 mg by mouth daily. 01/19/20  Yes [provider]  Multiple Vitamins-Minerals (CENTRUM SILVER 50+MEN) TABS Take 1 tablet by mouth daily.   Yes [provider]  omeprazole (PRILOSEC) 20 MG capsule Take 20 mg by mouth daily. 01/26/20  Yes [provider]  tamsulosin (FLOMAX) 0.4 MG CAPS capsule Take 0.4 mg by mouth daily. 03/02/20  Yes [provider]  traZODone (DESYREL) 50 MG tablet  03/02/20  Yes [provider]    Allergies    Patient has no allergy information on record.  Review of Systems   Review of Systems  Unable to perform ROS: Mental status change    Physical Exam Updated Vital Signs BP (!) 141/87    Pulse 72    Temp 99.3 F (37.4 C) (Oral)    Resp 16    Ht 6' (1.829 m)    Wt 57.4 kg    SpO2 100%    BMI 17.16 kg/m   Physical Exam Vitals and nursing note reviewed.  Constitutional:      Appearance: He is well-developed.     Comments: Thin elderly male sitting upright, smiling  HENT:     Head: Normocephalic and atraumatic.  Eyes:     Conjunctiva/sclera: Conjunctivae normal.  Cardiovascular:     Rate and Rhythm: Normal rate and regular rhythm.  Pulmonary:     Effort: Pulmonary effort is normal. No respiratory distress.     Breath sounds: No stridor.  Abdominal:     General: There is no distension.  Skin:    General: Skin is warm and dry.  Neurological:     Mental Status: He is alert. He is confused.     Comments: No facial asymmetry, and when the patient speaks, speech is clear.  However, the patient is repetitive and answers, with very brief, simple responses.  Does not follow commands reliably, or in a temporal appropriate time.  He does seem to be able to move all 4 extremities, spontaneously,  but does not reliably do so to command.  Psychiatric:        Cognition and Memory: Cognition is impaired. Memory is impaired.      ED Results / Procedures / Treatments   Labs (all labs ordered are listed, but only abnormal results are displayed) Labs Reviewed  RESP PANEL BY RT PCR (RSV, FLU A&B, COVID)  COMPREHENSIVE METABOLIC PANEL  CBC  ETHANOL  URINALYSIS, ROUTINE W REFLEX MICROSCOPIC  RAPID URINE DRUG SCREEN, HOSP PERFORMED  PROTIME-INR  CBG MONITORING, ED    EKG EKG Interpretation  Date/Time:  Monday March 27 2020 18:57:24 EST Ventricular Rate:  78 PR Interval:    QRS Duration: 83 QT Interval:  386 QTC Calculation: 440 R Axis:   25 Text  Interpretation: Sinus rhythm Atrial premature complex Anteroseptal infarct, age indeterminate Artifact Abnormal ECG Confirmed by Carmin Muskrat [id number]) on 03/27/2020 8:28:29 PM   Radiology CT HEAD WO CONTRAST  Result Date: 03/27/2020 CLINICAL DATA:  86 year old male with altered mental status. EXAM: CT HEAD WITHOUT CONTRAST TECHNIQUE: Contiguous axial images were obtained from the base of the skull through the vertex without intravenous contrast. COMPARISON:  None. FINDINGS: Brain: Left posterior temporal lobe intraparenchymal hemorrhage measures approximately 3.4 x 1.8 cm in greatest axial dimensions and 2.3 cm in craniocaudal length. There is mild surrounding edema. There is mild age-related atrophy and chronic microvascular ischemic changes. There is no midline shift. No extra-axial fluid collection. Vascular: No hyperdense vessel or unexpected calcification. Skull: Normal. Negative for fracture or focal lesion. Sinuses/Orbits: No acute finding. Other: None IMPRESSION: 1. 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 Further evaluation with MRI recommended. No midline shift. 2. Mild chronic microvascular ischemic changes. These results were called by telephone at the time of interpretation on 03/27/2020 at 7:34 pm to provider Carmin Muskrat , who verbally acknowledged these results. Electronically Signed   By: Anner Crete M.D.   On: 03/27/2020 19:37   DG Chest Port 1 View  Result Date: 03/27/2020 CLINICAL DATA:  Change in mental status EXAM: PORTABLE CHEST 1 VIEW COMPARISON:  November 10, 2008 FINDINGS: The heart size and mediastinal contours are within normal limits. Aortic knob calcifications are seen. Both lungs are clear. The visualized skeletal structures are unremarkable. IMPRESSION: No active disease. Electronically Signed   By: Prudencio Pair M.D.   On: 03/27/2020 19:25    Procedures Procedures (including critical care  time)  Medications Ordered in ED Medications  0.9 %  sodium chloride infusion ( Intravenous New Bag/Given 03/27/20 1941)  labetalol (NORMODYNE) injection 10 mg (has no administration in time range)  sodium chloride 0.9 % bolus 500 mL (has no administration in time range)    Followed by  0.9 %  sodium chloride infusion (has no administration in time range)    ED Course  I have reviewed the triage vital signs and the nursing notes.  Pertinent labs & imaging results that were available during my care of the patient were reviewed by me and considered in my medical decision making (see chart for details).   After initial evaluation with consideration of the patient's altered mental status, age, broad differential including infection, metabolic, stroke, all considered.  Patient had stat CT, labs, EKG. He is placed on continuous cardiac monitoring, pulse oximetry.  Update: I discussed this case with the radiologist. There is concern for intracranial hemorrhage.  On repeat exam patient is in similar condition. On cardiac monitor the patient has sinus rhythm, 80s, unremarkable, pulse  oximetry 96%, room air, unremarkable  With consideration of the patient's intraparenchymal bleed I discussed this case with the neurology colleagues. Patient will require transfer to our advanced care center for consideration of new stroke, confusion. Patient has stroke order set in process, Covid test pending, has received labetalol for blood pressure control and will continue to receive continuous monitoring pending transfer.  MDM Rules/Calculators/A&P MDM Number of Diagnoses or Management Options Acute spontaneous intraparenchymal intracranial hemorrhage ([place]): new, needed workup   Amount and/or Complexity of Data Reviewed Clinical lab tests: reviewed Tests in the radiology section of CPT: reviewed Tests in the medicine section of CPT: reviewed Discussion of test results with the performing providers:  yes Decide to obtain previous medical records or to obtain history from someone other than the patient: yes Obtain history from someone other than the patient: yes Review and summarize past medical records: yes Discuss the patient with other providers: yes Independent visualization of images, tracings, or specimens: yes  Risk of Complications, Morbidity, and/or Mortality Presenting problems: high Diagnostic procedures: high Management options: high  Critical Care Total time providing critical care: 30-74 minutes (40)  Patient Progress Patient progress: stable  Final Clinical Impression(s) / ED Diagnoses Final diagnoses:  Acute spontaneous intraparenchymal intracranial hemorrhage ([place])     Carmin Muskrat, MD 03/27/20 2031

## 2020-03-27 NOTE — ED Triage Notes (Signed)
Pt. Arrived from home via EMS. Per EMS pt. Has not been able to answer any of their questions for 2 days now. Pt. Has not been able to say their name or where they are line. Per family base line is alert and oriented x4.

## 2020-03-27 NOTE — ED Notes (Signed)
Pt to CT

## 2020-03-28 ENCOUNTER — Inpatient Hospital Stay (HOSPITAL_COMMUNITY): Payer: Medicare Other

## 2020-03-28 DIAGNOSIS — I611 Nontraumatic intracerebral hemorrhage in hemisphere, cortical: Secondary | ICD-10-CM | POA: Diagnosis not present

## 2020-03-28 DIAGNOSIS — I6389 Other cerebral infarction: Secondary | ICD-10-CM | POA: Diagnosis not present

## 2020-03-28 DIAGNOSIS — I361 Nonrheumatic tricuspid (valve) insufficiency: Secondary | ICD-10-CM

## 2020-03-28 DIAGNOSIS — I629 Nontraumatic intracranial hemorrhage, unspecified: Secondary | ICD-10-CM

## 2020-03-28 LAB — LIPID PANEL
Cholesterol: 250 mg/dL — ABNORMAL HIGH (ref 0–200)
HDL: 57 mg/dL (ref >40)
LDL Cholesterol: 171 mg/dL — ABNORMAL HIGH (ref 0–99)
Total CHOL/HDL Ratio: 4.4 RATIO
Triglycerides: 108 mg/dL (ref <150)
VLDL: 22 mg/dL (ref 0–40)

## 2020-03-28 LAB — ECHOCARDIOGRAM COMPLETE
Area-P 1/2: 3.37 cm2
Height: 72 in
S' Lateral: 3.5 cm
Weight: 2024.7 oz

## 2020-03-28 LAB — HEMOGLOBIN A1C
Hgb A1c MFr Bld: 6.3 % — ABNORMAL HIGH (ref 4.8–5.6)
Mean Plasma Glucose: 134.11 mg/dL

## 2020-03-28 LAB — MRSA PCR SCREENING: MRSA by PCR: NEGATIVE

## 2020-03-28 MED ORDER — HEPARIN SODIUM (PORCINE) 5000 UNIT/ML IJ SOLN
5000.0000 [IU] | Freq: Two times a day (BID) | INTRAMUSCULAR | Status: DC
  Administered 2020-03-28 – 2020-04-03 (×13): 5000 [IU] via SUBCUTANEOUS
  Filled 2020-03-28 (×13): qty 1

## 2020-03-28 MED ORDER — QUETIAPINE FUMARATE 25 MG PO TABS
25.0000 mg | ORAL_TABLET | Freq: Two times a day (BID) | ORAL | Status: DC
  Administered 2020-03-28: 25 mg via ORAL
  Filled 2020-03-28: qty 1

## 2020-03-28 MED ORDER — GADOBUTROL 1 MMOL/ML IV SOLN
5.0000 mL | Freq: Once | INTRAVENOUS | Status: AC | PRN
  Administered 2020-03-28: 5 mL via INTRAVENOUS

## 2020-03-28 MED ORDER — ACETAMINOPHEN 160 MG/5ML PO SOLN: 650.0000 mg | ORAL | Status: DC | PRN

## 2020-03-28 MED ORDER — ACETAMINOPHEN 325 MG PO TABS: 650.0000 mg | ORAL_TABLET | ORAL | Status: DC | PRN

## 2020-03-28 MED ORDER — SENNOSIDES-DOCUSATE SODIUM 8.6-50 MG PO TABS
1.0000 | ORAL_TABLET | Freq: Two times a day (BID) | ORAL | Status: DC
  Administered 2020-03-28 – 2020-04-03 (×11): 1 via ORAL
  Filled 2020-03-28 (×12): qty 1

## 2020-03-28 MED ORDER — LORAZEPAM 2 MG/ML IJ SOLN
2.0000 mg | Freq: Four times a day (QID) | INTRAMUSCULAR | Status: DC | PRN
  Administered 2020-03-28 – 2020-04-03 (×8): 2 mg via INTRAVENOUS
  Filled 2020-03-28 (×9): qty 1

## 2020-03-28 MED ORDER — HALOPERIDOL LACTATE 5 MG/ML IJ SOLN
5.0000 mg | Freq: Four times a day (QID) | INTRAMUSCULAR | Status: DC | PRN
  Administered 2020-03-29 – 2020-04-03 (×5): 5 mg via INTRAVENOUS
  Filled 2020-03-28 (×7): qty 1

## 2020-03-28 MED ORDER — IOHEXOL 350 MG/ML SOLN
80.0000 mL | Freq: Once | INTRAVENOUS | Status: AC | PRN
  Administered 2020-03-28: 80 mL via INTRAVENOUS

## 2020-03-28 MED ORDER — HALOPERIDOL LACTATE 5 MG/ML IJ SOLN
2.0000 mg | Freq: Four times a day (QID) | INTRAMUSCULAR | Status: DC | PRN
  Administered 2020-03-28 (×2): 2 mg via INTRAVENOUS
  Filled 2020-03-28 (×2): qty 1

## 2020-03-28 MED ORDER — CHLORHEXIDINE GLUCONATE CLOTH 2 % EX PADS
6.0000 | MEDICATED_PAD | Freq: Every day | CUTANEOUS | Status: DC
  Administered 2020-03-28 – 2020-04-02 (×5): 6 via TOPICAL

## 2020-03-28 MED ORDER — LABETALOL HCL 5 MG/ML IV SOLN
20.0000 mg | Freq: Once | INTRAVENOUS | Status: AC
  Administered 2020-03-28: 20 mg via INTRAVENOUS
  Filled 2020-03-28: qty 4

## 2020-03-28 MED ORDER — ACETAMINOPHEN 650 MG RE SUPP: 650.0000 mg | RECTAL | Status: DC | PRN

## 2020-03-28 MED ORDER — LORAZEPAM 2 MG/ML IJ SOLN
2.0000 mg | Freq: Once | INTRAMUSCULAR | Status: AC
  Administered 2020-03-28: 2 mg via INTRAVENOUS
  Filled 2020-03-28: qty 1

## 2020-03-28 MED ORDER — CLEVIDIPINE BUTYRATE 0.5 MG/ML IV EMUL
0.0000 mg/h | INTRAVENOUS | Status: DC
  Administered 2020-03-28: 5 mg/h via INTRAVENOUS
  Administered 2020-03-28: 2 mg/h via INTRAVENOUS
  Administered 2020-03-29: 1 mg/h via INTRAVENOUS
  Filled 2020-03-28 (×3): qty 50

## 2020-03-28 MED ORDER — PANTOPRAZOLE SODIUM 40 MG IV SOLR
40.0000 mg | Freq: Every day | INTRAVENOUS | Status: DC
  Administered 2020-03-28: 40 mg via INTRAVENOUS
  Filled 2020-03-28: qty 40

## 2020-03-28 MED ORDER — STROKE: EARLY STAGES OF RECOVERY BOOK
Freq: Once | Status: AC
  Filled 2020-03-28: qty 1

## 2020-03-28 NOTE — Progress Notes (Addendum)
Pt's Son is at bedside asking when he can speak to stroke doctor regarding goals of care for the patient. I told him that the stroke team will typically round in the morning/ during the day shift and he explained that him and his sister are working during this time. Son's phone number is [phone].

## 2020-03-28 NOTE — ED Notes (Signed)
Pt removed IV; site clean, dry, intact

## 2020-03-28 NOTE — ED Notes (Signed)
ED TO INPATIENT HANDOFF REPORT  ED Nurse Name and Phone #:  [phone]  S Name/Age/Gender Ryan Adams 86 y.o. male Room/Bed: APA02/APA02  Code Status   Code Status: Not on file  Home/SNF/Other Home  Is this baseline? No   Triage Complete: Triage complete  Chief Complaint ICH (intracerebral hemorrhage) ([place]) [I61.9]  Triage Note Pt. Arrived from home via EMS. Per EMS pt. Has not been able to answer any of their questions for 2 days now. Pt. Has not been able to say their name or where they are line. Per family base line is alert and oriented x4.    Allergies Not on File  Level of Care/Admitting Diagnosis ED Disposition    ED Disposition Condition [place] [facility] Area: [place] [[id number]]  Level of Care: ICU [6]  May admit patient to [facility] or [facility] if equivalent level of care is available:: No  Covid Evaluation: Asymptomatic Screening Protocol (No Symptoms)  Diagnosis: ICH (intracerebral hemorrhage) [facility]) [527782]  Admitting Physician: Amie Portland [[id number]]  Attending Physician: Amie Portland [[id number]]  Estimated length of stay: past midnight tomorrow  Certification:: I certify this patient will need inpatient services for at least 2 midnights       B Medical/Surgery History No past medical history on file.    A IV Location/Drains/Wounds Patient Lines/Drains/Airways Status    Active Line/Drains/Airways    Name Placement date Placement time Site Days   Peripheral IV 03/27/20 Anterior;Distal;Left;Upper Arm 03/27/20  1853  Arm  1   Peripheral IV 03/28/20 Left Forearm 03/28/20  0002  Forearm  less than 1          Intake/Output Last 24 hours No intake or output data in the 24 hours ending 03/28/20 0208  Labs/Imaging Results for orders placed or performed during the hospital encounter of 03/27/20 (from the past 48 hour(s))  Urinalysis, Routine w reflex microscopic Urine, Clean Catch     Status: Abnormal    Collection Time: 03/27/20  7:06 PM  Result Value Ref Range   Color, Urine YELLOW YELLOW   APPearance CLEAR CLEAR   Specific Gravity, Urine 1.012 1.005 - 1.030   pH 7.0 5.0 - 8.0   Glucose, UA NEGATIVE NEGATIVE mg/dL   Hgb urine dipstick NEGATIVE NEGATIVE   Bilirubin Urine NEGATIVE NEGATIVE   Ketones, ur 5 (A) NEGATIVE mg/dL   Protein, ur 30 (A) NEGATIVE mg/dL   Nitrite NEGATIVE NEGATIVE   Leukocytes,Ua NEGATIVE NEGATIVE   RBC / HPF 0-5 0 - 5 RBC/hpf   WBC, UA 0-5 0 - 5 WBC/hpf   Bacteria, UA NONE SEEN NONE SEEN   Mucus PRESENT     Comment: Performed at [facility], [address] [place], [place] [address]  Resp Panel by RT PCR (RSV, Flu A&B, Covid) - Nasopharyngeal Swab     Status: None   Collection Time: 03/27/20  7:06 PM   Specimen: Nasopharyngeal Swab  Result Value Ref Range   SARS Coronavirus 2 by RT PCR NEGATIVE NEGATIVE    Comment: (NOTE) SARS-CoV-2 target nucleic acids are NOT DETECTED.  The SARS-CoV-2 RNA is generally detectable in upper respiratoy specimens during the acute phase of infection. The lowest concentration of SARS-CoV-2 viral copies this assay can detect is 131 copies/mL. A negative result does not preclude SARS-Cov-2 infection and should not be used as the sole basis for treatment or other patient management decisions. A negative result may occur with  improper specimen collection/handling, submission of  specimen other than nasopharyngeal swab, presence of viral mutation(s) within the areas targeted by this assay, and inadequate number of viral copies (<131 copies/mL). A negative result must be combined with clinical observations, patient history, and epidemiological information. The expected result is Negative.  Fact Sheet for Patients:  PinkCheek.be  Fact Sheet for Healthcare Providers:  GravelBags.it  This test is no t yet approved or cleared by the [place] FDA and  has been  authorized for detection and/or diagnosis of SARS-CoV-2 by FDA under an Emergency Use Authorization (EUA). This EUA will remain  in effect (meaning this test can be used) for the duration of the COVID-19 declaration under Section 564(b)(1) of the Act, 21 U.S.C. section 360bbb-3(b)(1), unless the authorization is terminated or revoked sooner.     Influenza A by PCR NEGATIVE NEGATIVE   Influenza B by PCR NEGATIVE NEGATIVE    Comment: (NOTE) The Xpert Xpress SARS-CoV-2/FLU/RSV assay is intended as an aid in  the diagnosis of influenza from Nasopharyngeal swab specimens and  should not be used as a sole basis for treatment. Nasal washings and  aspirates are unacceptable for Xpert Xpress SARS-CoV-2/FLU/RSV  testing.  Fact Sheet for Patients: PinkCheek.be  Fact Sheet for Healthcare Providers: GravelBags.it  This test is not yet approved or cleared by the [place] FDA and  has been authorized for detection and/or diagnosis of SARS-CoV-2 by  FDA under an Emergency Use Authorization (EUA). This EUA will remain  in effect (meaning this test can be used) for the duration of the  Covid-19 declaration under Section 564(b)(1) of the Act, 21  U.S.C. section 360bbb-3(b)(1), unless the authorization is  terminated or revoked.    Respiratory Syncytial Virus by PCR NEGATIVE NEGATIVE    Comment: (NOTE) Fact Sheet for Patients: PinkCheek.be  Fact Sheet for Healthcare Providers: GravelBags.it  This test is not yet approved or cleared by the [place] FDA and  has been authorized for detection and/or diagnosis of SARS-CoV-2 by  FDA under an Emergency Use Authorization (EUA). This EUA will remain  in effect (meaning this test can be used) for the duration of the  COVID-19 declaration under Section 564(b)(1) of the Act, 21 U.S.C.  section 360bbb-3(b)(1), unless the  authorization is terminated or  revoked. Performed at [facility], [address], [place], [place] [address]   CBG monitoring, ED     Status: None   Collection Time: 03/27/20  7:11 PM  Result Value Ref Range   Glucose-Capillary 89 70 - 99 mg/dL    Comment: Glucose reference range applies only to samples taken after fasting for at least 8 hours.  Urine rapid drug screen (hosp performed)not at [facility]     Status: None   Collection Time: 03/27/20  8:05 PM  Result Value Ref Range   Opiates NONE DETECTED NONE DETECTED   Cocaine NONE DETECTED NONE DETECTED   Benzodiazepines NONE DETECTED NONE DETECTED   Amphetamines NONE DETECTED NONE DETECTED   Tetrahydrocannabinol NONE DETECTED NONE DETECTED   Barbiturates NONE DETECTED NONE DETECTED    Comment: (NOTE) DRUG SCREEN FOR MEDICAL PURPOSES ONLY.  IF CONFIRMATION IS NEEDED FOR ANY PURPOSE, NOTIFY LAB WITHIN 5 DAYS.  LOWEST DETECTABLE LIMITS FOR URINE DRUG SCREEN Drug Class                     Cutoff (ng/mL) Amphetamine and metabolites    1000 Barbiturate and metabolites    200 Benzodiazepine  381 Tricyclics and metabolites     300 Opiates and metabolites        300 Cocaine and metabolites        300 THC                            50 Performed at [facility], [address] [place], [place] [address]   Comprehensive metabolic panel     Status: Abnormal   Collection Time: 03/27/20  9:05 PM  Result Value Ref Range   Sodium 132 (L) 135 - 145 mmol/L   Potassium 3.9 3.5 - 5.1 mmol/L   Chloride 97 (L) 98 - 111 mmol/L   CO2 25 22 - 32 mmol/L   Glucose, Bld 104 (H) 70 - 99 mg/dL    Comment: Glucose reference range applies only to samples taken after fasting for at least 8 hours.   BUN 22 8 - 23 mg/dL   Creatinine, Ser 1.29 (H) 0.61 - 1.24 mg/dL   Calcium 8.9 8.9 - 10.3 mg/dL   Total Protein 6.3 (L) 6.5 - 8.1 g/dL   Albumin 3.9 3.5 - 5.0 g/dL   AST 31 15 - 41 U/L   ALT 16 0 - 44 U/L   Alkaline Phosphatase 46 38 - 126 U/L    Total Bilirubin 0.8 0.3 - 1.2 mg/dL   GFR, Estimated 53 (L) >60 mL/min    Comment: (NOTE) Calculated using the CKD-EPI Creatinine Equation (2021)    Anion gap 10 5 - 15    Comment: Performed at [facility], [address] [place], [place] [address]  CBC     Status: Abnormal   Collection Time: 03/27/20  9:05 PM  Result Value Ref Range   WBC 6.7 4.0 - 10.5 K/uL   RBC 4.16 (L) 4.22 - 5.81 MIL/uL   Hemoglobin 10.3 (L) 13.0 - 17.0 g/dL   HCT 32.5 (L) 39 - 52 %   MCV 78.1 (L) 80.0 - 100.0 fL   MCH 24.8 (L) 26.0 - 34.0 pg   MCHC 31.7 30.0 - 36.0 g/dL   RDW 15.3 11.5 - 15.5 %   Platelets 190 150 - 400 K/uL   nRBC 0.0 0.0 - 0.2 %    Comment: Performed at [facility], [address], [place], [place] [address]  Ethanol     Status: None   Collection Time: 03/27/20  9:05 PM  Result Value Ref Range   Alcohol, Ethyl (B) <10 <10 mg/dL    Comment: (NOTE) Lowest detectable limit for serum alcohol is 10 mg/dL.  For medical purposes only. Performed at [facility], [address], [place], [place] [address]   Protime-INR     Status: None   Collection Time: 03/27/20  9:05 PM  Result Value Ref Range   Prothrombin Time 13.9 11.4 - 15.2 seconds   INR 1.1 0.8 - 1.2    Comment: (NOTE) INR goal varies based on device and disease states. Performed at [facility], [address] [place], [place] [address]    CT HEAD WO CONTRAST  Result Date: 03/27/2020 CLINICAL DATA:  86 year old male with altered mental status. EXAM: CT HEAD WITHOUT CONTRAST TECHNIQUE: Contiguous axial images were obtained from the base of the skull through the vertex without intravenous contrast. COMPARISON:  None. FINDINGS: Brain: Left posterior temporal lobe intraparenchymal hemorrhage measures approximately 3.4 x 1.8 cm in greatest axial dimensions and 2.3 cm in craniocaudal length. There is mild surrounding edema. There  is mild age-related atrophy and chronic microvascular ischemic changes. There is no midline shift. No  extra-axial fluid collection. Vascular: No hyperdense vessel or unexpected calcification. Skull: Normal. Negative for fracture or focal lesion. Sinuses/Orbits: No acute finding. Other: None IMPRESSION: 1. 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 Further evaluation with MRI recommended. No midline shift. 2. Mild chronic microvascular ischemic changes. These results were called by telephone at the time of interpretation on 03/27/2020 at 7:34 pm to provider Carmin Muskrat , who verbally acknowledged these results. Electronically Signed   By: Anner Crete M.D.   On: 03/27/2020 19:37   DG Chest Port 1 View  Result Date: 03/27/2020 CLINICAL DATA:  Change in mental status EXAM: PORTABLE CHEST 1 VIEW COMPARISON:  November 10, 2008 FINDINGS: The heart size and mediastinal contours are within normal limits. Aortic knob calcifications are seen. Both lungs are clear. The visualized skeletal structures are unremarkable. IMPRESSION: No active disease. Electronically Signed   By: Prudencio Pair M.D.   On: 03/27/2020 19:25    Pending Labs Unresulted Labs (From admission, onward)         None      Vitals/Pain Today's Vitals   03/28/20 0005 03/28/20 0030 03/28/20 0100 03/28/20 0130  BP:  129/73 132/70 126/65  Pulse:  72 74 72  Resp:  18    Temp: 99 F (37.2 C)  98.9 F (37.2 C)   TempSrc: Oral  Oral   SpO2:  94% 97% 96%  Weight:      Height:      PainSc: 0-No pain  0-No pain     Isolation Precautions No active isolations  Medications Medications  0.9 %  sodium chloride infusion ( Intravenous Stopped 03/27/20 2237)  sodium chloride 0.9 % bolus 500 mL (0 mLs Intravenous Hold 03/27/20 2114)    Followed by  0.9 %  sodium chloride infusion (has no administration in time range)  clevidipine (CLEVIPREX) infusion 0.5 mg/mL (1 mg/hr Intravenous New Bag/Given 03/27/20 2237)  labetalol (NORMODYNE) injection 10 mg (10 mg Intravenous Given  03/27/20 2041)    Mobility walks with person assist Moderate fall risk   Focused Assessments    R Recommendations: See Admitting Provider Note  Report given to:   Additional Notes:

## 2020-03-28 NOTE — Progress Notes (Signed)
  Echocardiogram 2D Echocardiogram has been performed.  Ryan Adams 03/28/2020, 8:46 AM

## 2020-03-28 NOTE — Progress Notes (Signed)
STROKE TEAM PROGRESS NOTE   SUBJECTIVE (INTERVAL HISTORY) His RN and echo tech are at the bedside.  Pt pleasantly confused. Global aphasia. Overnight, agitated and combative and trying to get out of bed, currently in restrain.    OBJECTIVE Temp:  [98.6 F (37 C)-99.4 F (37.4 C)] 99.4 F (37.4 C) (11/16 0347) Pulse Rate:  [72-86] 81 (11/16 0600) Resp:  [9-27] 23 (11/16 0600) BP: (119-168)/(65-102) 119/87 (11/16 0500) SpO2:  [93 %-100 %] 98 % (11/16 0600) Weight:  [57.4 kg] 57.4 kg (11/15 1859)  Recent Labs  Lab 03/27/20 1911  GLUCAP 89   Recent Labs  Lab 03/27/20 2105  NA 132*  K 3.9  CL 97*  CO2 25  GLUCOSE 104*  BUN 22  CREATININE 1.29*  CALCIUM 8.9   Recent Labs  Lab 03/27/20 2105  AST 31  ALT 16  ALKPHOS 46  BILITOT 0.8  PROT 6.3*  ALBUMIN 3.9   Recent Labs  Lab 03/27/20 2105  WBC 6.7  HGB 10.3*  HCT 32.5*  MCV 78.1*  PLT 190   No results for input(s): CKTOTAL, CKMB, CKMBINDEX, TROPONINI in the last 168 hours. Recent Labs    03/27/20 2105  LABPROT 13.9  INR 1.1   Recent Labs    03/27/20 1906  COLORURINE YELLOW  LABSPEC 1.012  PHURINE 7.0  GLUCOSEU NEGATIVE  HGBUR NEGATIVE  BILIRUBINUR NEGATIVE  KETONESUR 5*  PROTEINUR 30*  NITRITE NEGATIVE  LEUKOCYTESUR NEGATIVE       Component Value Date/Time   CHOL 250 (H) 03/28/2020 0558   TRIG 108 03/28/2020 0558   HDL 57 03/28/2020 0558   CHOLHDL 4.4 03/28/2020 0558   VLDL 22 03/28/2020 0558   LDLCALC 171 (H) 03/28/2020 0558   Lab Results  Component Value Date   HGBA1C 6.3 (H) 03/28/2020      Component Value Date/Time   LABOPIA NONE DETECTED 03/27/2020 2005   COCAINSCRNUR NONE DETECTED 03/27/2020 2005   LABBENZ NONE DETECTED 03/27/2020 2005   AMPHETMU NONE DETECTED 03/27/2020 2005   THCU NONE DETECTED 03/27/2020 2005   LABBARB NONE DETECTED 03/27/2020 2005    Recent Labs  Lab 03/27/20 2105  ETH <10    I have personally reviewed the radiological images below and agree with  the radiology interpretations.  CT HEAD WO CONTRAST  Result Date: 03/27/2020 CLINICAL DATA:  86 year old male with altered mental status. EXAM: CT HEAD WITHOUT CONTRAST TECHNIQUE: Contiguous axial images were obtained from the base of the skull through the vertex without intravenous contrast. COMPARISON:  None. FINDINGS: Brain: Left posterior temporal lobe intraparenchymal hemorrhage measures approximately 3.4 x 1.8 cm in greatest axial dimensions and 2.3 cm in craniocaudal length. There is mild surrounding edema. There is mild age-related atrophy and chronic microvascular ischemic changes. There is no midline shift. No extra-axial fluid collection. Vascular: No hyperdense vessel or unexpected calcification. Skull: Normal. Negative for fracture or focal lesion. Sinuses/Orbits: No acute finding. Other: None IMPRESSION: 1. 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 Further evaluation with MRI recommended. No midline shift. 2. Mild chronic microvascular ischemic changes. These results were called by telephone at the time of interpretation on 03/27/2020 at 7:34 pm to provider Carmin Muskrat , who verbally acknowledged these results. Electronically Signed   By: Anner Crete M.D.   On: 03/27/2020 19:37   DG Chest Port 1 View  Result Date: 03/27/2020 CLINICAL DATA:  Change in mental status EXAM: PORTABLE CHEST 1 VIEW COMPARISON:  November 10, 2008 FINDINGS:  The heart size and mediastinal contours are within normal limits. Aortic knob calcifications are seen. Both lungs are clear. The visualized skeletal structures are unremarkable. IMPRESSION: No active disease. Electronically Signed   By: Prudencio Pair M.D.   On: 03/27/2020 19:25    PHYSICAL EXAM  Temp:  [98.6 F (37 C)-99.4 F (37.4 C)] 99.4 F (37.4 C) (11/16 0347) Pulse Rate:  [72-86] 81 (11/16 0600) Resp:  [9-27] 23 (11/16 0600) BP: (119-168)/(65-102) 119/87 (11/16 0500) SpO2:  [93  %-100 %] 98 % (11/16 0600) Weight:  [57.4 kg] 57.4 kg (11/15 1859)  General - Well nourished, well developed, in no apparent distress, intermittent agitation.  Ophthalmologic - fundi not visualized due to noncooperation.  Cardiovascular - Regular rhythm and rate.  Neuro - pleasant, smiling, but intermittent agitation. Eyes open, able to say "yes" "OK" to all questions. Not following simple commands, able to mimic right hand movement 2 out of 3 times. One time he was able to say "what do you say?" difficult checking visual field, but intermittently blinking to visual threat bilaterally, able to track both sides. Able to gaze bilaterally. No facial droop. PERRL. Tongue protrusion not cooperative. Moving BUE and BLEs symmetrically. Sensation, coordination and gait not tested.  ASSESSMENT/PLAN Mr. Ryan Adams is a 86 y.o. male with history of diabetes and hypertension admitted for confusion, aphasia. No tPA given due to [place].    ICH: Left temporal ICH, hypertensive vs CAA  Resultant agitation and confusion  CT head left temporal ICH  MRI acute hematoma left temporoparietal lobe, unchanged.  Chronic microhemorrhages in the brain suggesting CAA versus hypertension.  No acute infarct.  CT head and neck unremarkable  2D Echo EF 60 to 65%  LDL 171  HgbA1c 6.3  Heparin subcu for VTE prophylaxis  No antithrombotic prior to admission, now on No antithrombotic due to [place]  Ongoing aggressive stroke risk factor management  Therapy recommendations: Pending  Disposition: Pending  Diabetes  HgbA1c 6.3 goal < 7.0  Controlled  CBG monitoring  SSI  DM education and close PCP follow up  Hypertension . Stable . On low-dose Cleviprex . Consider p.o. BP meds once p.o. access  Long term BP goal normotensive  Hyperlipidemia  Home meds: None  LDL 171, goal < 70  Consider statin at discharge  Agitation  Likely due to [place] in the setting of underlying cognitive  impairment  Haldol as needed  Consider Seroquel once p.o. access  Other Stroke Risk Factors  Advanced age  Other Active Problems  Hyponatremia, sodium 132  Macrocytic anemia hemoglobin 10.3 - iron panel pending  Hospital day # 1  This patient is critically ill due to [place], hypertensive emergency, agitation and confusion and at significant risk of neurological worsening, death form hematoma expansion, cerebral edema, brain herniation, hypertensive encephalopathy, seizure. This patient's care requires constant monitoring of vital signs, hemodynamics, respiratory and cardiac monitoring, review of multiple databases, neurological assessment, discussion with family, other specialists and medical decision making of high complexity. I spent 40 minutes of neurocritical care time in the care of this patient.  Rosalin Hawking, MD PhD Stroke Neurology 03/28/2020 8:30 AM    To contact Stroke Continuity provider, please refer to [email or web address] After hours, contact General Neurology

## 2020-03-28 NOTE — ED Notes (Signed)
Pt off unit by [facility]

## 2020-03-28 NOTE — H&P (Signed)
STROKE ICH H&P  CC: Confusion  History is obtained from: Chart review, over the phone from son Ebony Hail [phone]  HPI: Ryan Adams is a 86 y.o. male past medical history of diabetes, hypertension, up until last Saturday-03/25/2020-living independently, still driving, noted to be confused by family this morning and brought in to [facility] for evaluation of confusion.  Brain imaging revealed a left posterior temporal ICH.  Patient was unable to provide any history and still is unable to provide any history. Spoke with the son over the phone who said that the patient lives independently, although he does not do his finances or bills he still drives he is able to take care of his ADLs independently and the family has not noticed any memory decline in him. He was last seen normal Saturday by family and on Monday morning was noted to be acting confused with his speech making no sense, which prompted the visit to the emergency room at [facility]. The ED providers evaluated him, were concerned for expressive aphasia, obtain a head CT that showed the bleed described below. Family also question that he might of had a fall based on the fact that he had some bruising on his left temple but no body witnessed the fall. Patient unable to provide any history   LKW: Sometime on Saturday, 03/25/2020 tpa given?: no, ICH Premorbid modified Rankin scale (mRS): 0   ROS: Unable to ascertain from the patient due to his expressive aphasia  No past medical history on file. Past medical history of diabetes and hypertension provided by son  No family history on file.   Social History:   has no history on file for tobacco use, alcohol use, and drug use.  Medications  Current Facility-Administered Medications:     stroke: mapping our early stages of recovery book, , Does not apply, Once, Amie Portland, MD   0.9 %  sodium chloride infusion, , Intravenous, Continuous, Carmin Muskrat, MD, Stopped at 03/27/20 2237   sodium chloride 0.9 % bolus 500 mL, 500 mL, Intravenous, Once, Held at 03/27/20 2114 **FOLLOWED BY** 0.9 %  sodium chloride infusion, 100 mL/hr, Intravenous, Continuous, Carmin Muskrat, MD   acetaminophen (TYLENOL) tablet 650 mg, 650 mg, Oral, Q4H PRN **OR** acetaminophen (TYLENOL) 160 MG/5ML solution 650 mg, 650 mg, Per Tube, Q4H PRN **OR** acetaminophen (TYLENOL) suppository 650 mg, 650 mg, Rectal, Q4H PRN, Amie Portland, MD   Chlorhexidine Gluconate Cloth 2 % PADS 6 each, 6 each, Topical, Daily, Amie Portland, MD   labetalol (NORMODYNE) injection 20 mg, 20 mg, Intravenous, Once **AND** clevidipine (CLEVIPREX) infusion 0.5 mg/mL, 0-21 mg/hr, Intravenous, Continuous, Amie Portland, MD   pantoprazole (PROTONIX) injection 40 mg, 40 mg, Intravenous, QHS, Amie Portland, MD   senna-docusate (Senokot-S) tablet 1 tablet, 1 tablet, Oral, BID, Amie Portland, MD  Exam: Current vital signs: BP 134/75 (BP Location: Left Arm)    Pulse 80    Temp 98.6 F (37 C) (Oral)    Resp 13    Ht 6' (1.829 m)    Wt 57.4 kg    SpO2 100%    BMI 17.16 kg/m  Vital signs in last 24 hours: Temp:  [98.6 F (37 C)-99.3 F (37.4 C)] 98.6 F (37 C) (11/16 0233) Pulse Rate:  [72-86] 80 (11/16 0347) Resp:  [9-23] 13 (11/16 0347) BP: (126-168)/(65-102) 134/75 (11/16 0347) SpO2:  [93 %-100 %] 100 % (11/16 0347) Weight:  [57.4 kg] 57.4 kg (11/15 1859)  GENERAL: Awake, alert in  NAD HEENT: - Normocephalic and atraumatic, dry mm, no LN++, no Thyromegally LUNGS - Clear to auscultation bilaterally with no wheezes CV - S1S2 RRR, no m/r/g, equal pulses bilaterally. ABDOMEN - Soft, nontender, nondistended with normoactive BS Ext: warm, well perfused, intact peripheral pulses  NEURO:  Mental Status: Awake, alert.  Follows commands. Language: speech is garbled and dysarthric with complete word salad.  Naming, repetition, fluency impaired but comprehension is intact. Cranial Nerves:  PERRL EOMI, visual fields full no facial asymmetry, facial sensation intact, hearing intact, tongue/uvula/soft palate midline, normal sternocleidomastoid and trapezius muscle strength. No evidence of tongue atrophy or fibrillations Motor: No drift in any of the 4 extremities Tone: is normal and bulk is normal Sensation- Intact to light touch bilaterally Coordination: Difficult to reliably ascertain but no obvious dysmetria Gait- deferred  NIHSS 1a Level of Conscious.:0  1b LOC Questions: 2 1c LOC Commands: 0 2 Best Gaze: 0 3 Visual: 0 4 Facial Palsy: 0 5a Motor Arm - left: 0 5b Motor Arm - Right: 0 6a Motor Leg - Left: 0 6b Motor Leg - Right: 0 7 Limb Ataxia: 0 8 Sensory: 0 9 Best Language: 2 10 Dysarthria: 2 11 Extinct. and Inatten.: 0 TOTAL: 6    Labs I have reviewed labs in epic and the results pertinent to this consultation are:  CBC    Component Value Date/Time   WBC 6.7 03/27/2020 2105   RBC 4.16 (L) 03/27/2020 2105   HGB 10.3 (L) 03/27/2020 2105   HCT 32.5 (L) 03/27/2020 2105   PLT 190 03/27/2020 2105   MCV 78.1 (L) 03/27/2020 2105   MCH 24.8 (L) 03/27/2020 2105   MCHC 31.7 03/27/2020 2105   RDW 15.3 03/27/2020 2105    CMP     Component Value Date/Time   NA 132 (L) 03/27/2020 2105   K 3.9 03/27/2020 2105   CL 97 (L) 03/27/2020 2105   CO2 25 03/27/2020 2105   GLUCOSE 104 (H) 03/27/2020 2105   BUN 22 03/27/2020 2105   CREATININE 1.29 (H) 03/27/2020 2105   CALCIUM 8.9 03/27/2020 2105   PROT 6.3 (L) 03/27/2020 2105   ALBUMIN 3.9 03/27/2020 2105   AST 31 03/27/2020 2105   ALT 16 03/27/2020 2105   ALKPHOS 46 03/27/2020 2105   BILITOT 0.8 03/27/2020 2105   GFRNONAA 53 (L) 03/27/2020 2105   GFRAA  03/15/2009 1316    >60        The eGFR has been calculated using the MDRD equation. This calculation has not been validated in all clinical situations. eGFR's persistently <60 mL/min signify possible Chronic Kidney Disease.   Urinary toxicology screen  negative Urinalysis negative  Imaging I have reviewed the images obtained:  CT-scan of the brain-left posterior temporal lobe intraparenchymal hemorrhage.  Mild chronic microvascular changes.  Assessment:  86 year old man with past history of diabetes hypertension, with sudden onset of confusion.  On examination reveals expressive aphasia with no focal motor or sensory deficits. CT head with left posterior temporal lobe IPH. ICH score-1 given age greater than 14. Etiology likely hypertensive versus cerebral amyloid angiopathy.  Recommendations: Admit to ICU Goal blood pressure less than 140.  Use Cleviprex. Can use as needed labetalol and hydralazine IV pushes as well. Frequent neurochecks Telemetry MRI brain with and without contrast ordered for 8 AM. MRA head w/o contrast No antiplatelets or anticoagulants SCD for DVT prophylaxis Check a.m. labs Soft wrist and wrist restraints ordered as patient is attempting to pull on his IVs and is  unable to understand the complexity of his medical situation.    Assessment:   Plan: Cortical ICH, nontraumatic Acuity: Acute Laterality: lrft Current suspected etiology: Hypertension versus cerebral amyloid angiopathy Treatment: -Admit to neurological ICU -ICH Score: 1 -BP control goal SYS<140.  Can liberalize after morning rounds and imaging if stable. -MRI brain with without contrast at 0800 hrs. -MRA without contrast -PT/OT/ST  -neuromonitoring  CNS -Close neuro monitoring  Dysarthria Dysphagia following ICH  -NPO until cleared by speech -ST -Advance diet as tolerated  Hemiplegia and hemiparesis following nontraumatic intracerebral hemorrhage affecting right dominant side  -Continue PT/OT/ST  RESP No active issues Monitor clinically  CV Hypertensive Encephalopathy Essential (primary) hypertension Hypertensive Emergency -Aggressive BP control, goal SBP <140 -Cleviprex drip plus minus as needed labetalol IV and  hydralazine IV  -Transthoracic echo  GI/GU AKI Gentle hydration Recheck labs again in the morning  HEME Iron Deficiency Anemia -Monitor -transfuse for hgb < 7  ENDO type 2 diabetes mellitus with hyperglycemia  -SSI -goal HgbA1c < 7  Fluid/Electrolyte Disorders Check labs and replete electrolytes as necessary.  ID Possible Aspiration PNA -CXR -NPO -Monitor   Prophylaxis DVT: SCD GI: PPI Bowel: Docusate senna  Dispo: To be determined  Diet: NPO until cleared by speech  Code Status: Full Code    Obtain outside records when possible  Updated son on the plan.  Provided patient's bed number as well as phone number to the unit.  THE FOLLOWING WERE PRESENT ON ADMISSION: Intracerebral hemorrhage, dysphagia, dysarthria, possible aspiration pneumonia, hypertensive emergency, acute kidney injury.  -- Amie Portland, MD [facility] Pager: [phone] If 7pm to 7am, please call on call as listed on AMION.  CRITICAL CARE ATTESTATION Performed by: Amie Portland, MD Total critical care time: 60 minutes Critical care time was exclusive of separately billable procedures and treating other patients and/or supervising APPs/Residents/Students Critical care was necessary to treat or prevent imminent or life-threatening deterioration due to intracerebral hemorrhage, hypertensive emergency. This patient is critically ill and at significant risk for neurological worsening and/or death and care requires constant monitoring. Critical care was time spent personally by me on the following activities: development of treatment plan with patient and/or surrogate as well as nursing, discussions with consultants, evaluation of patient's response to treatment, examination of patient, obtaining history from patient or surrogate, ordering and performing treatments and interventions, ordering and review of laboratory studies, ordering and review of radiographic studies, pulse oximetry,  re-evaluation of patient's condition, participation in multidisciplinary rounds and medical decision making of high complexity in the care of this patient.

## 2020-03-28 NOTE — Evaluation (Signed)
Clinical/Bedside Swallow Evaluation Patient Details  Name: Ryan Adams MRN: [id number] Date of Birth: 04/15/1928  Today's Date: 03/28/2020 Time: SLP Start Time (ACUTE ONLY): 0955 SLP Stop Time (ACUTE ONLY): 1008 SLP Time Calculation (min) (ACUTE ONLY): 13 min  Past Medical History: No past medical history on file.  HPI:  Pt is 86 year old man with past history of diabetes, hypertension, with sudden onset of confusion.  On examination revealed dysarthria, expressive aphasia with no focal motor or sensory deficits. 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    Assessment / Plan / Recommendation Clinical Impression  Pt was seen for BSE. He required cueing to engage in the evaluation, but demonstrated automatic awareness of POs. Pt was observed with thin liquid, puree, and solids. He tolerated all POs well without any overt s/sx of aspiration. Recommend regular diet and thin liquids. Due to pt's cognitive status, meds may need to be crushed.  SLP Visit Diagnosis: Dysphagia, unspecified (R13.10)    Aspiration Risk  Mild aspiration risk    Diet Recommendation Regular;Thin liquid   Liquid Administration via: Cup;Straw Medication Administration: Crushed with puree Supervision: Staff to assist with self feeding Compensations: Minimize environmental distractions Postural Changes: Seated upright at 90 degrees    Other  Recommendations Oral Care Recommendations: Oral care BID   Follow up Recommendations        Frequency and Duration            Prognosis        Swallow Study   General HPI: Pt is 86 year old man with past history of diabetes, hypertension, with sudden onset of confusion.  On examination revealed dysarthria, expressive aphasia with no focal motor or sensory deficits. 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  Type of Study: Bedside Swallow Evaluation Diet Prior to this Study: NPO Temperature Spikes Noted:  No Respiratory Status: Room air History of Recent Intubation: No Behavior/Cognition: Requires cueing Oral Care Completed by SLP: No Self-Feeding Abilities: Total assist Patient Positioning: Upright in bed Baseline Vocal Quality: Low vocal intensity    Oral/Motor/Sensory Function Overall Oral Motor/Sensory Function: Within functional limits   Ice Chips Ice chips: Not tested   Thin Liquid Thin Liquid: Within functional limits    Nectar Thick Nectar Thick Liquid: Not tested   Honey Thick Honey Thick Liquid: Not tested   Puree Puree: Within functional limits   Solid     Solid: Within functional limits      Greggory Keen 03/28/2020,10:43 AM

## 2020-03-28 NOTE — Progress Notes (Signed)
PT Cancellation Note  Patient Details Name: JAESON MOLSTAD MRN: [id number] DOB: [birth year]   Cancelled Treatment:    Reason Eval/Treat Not Completed: Active bedrest order Will await increase in activity orders prior to PT evaluation. Will follow.   Marguarite Arbour A Renny Gunnarson 03/28/2020, 7:06 AM Marisa Severin, PT, DPT Acute Rehabilitation Services Pager [phone] Office [phone]

## 2020-03-28 NOTE — ED Notes (Signed)
Report to Narcissa with [facility]

## 2020-03-28 NOTE — Evaluation (Signed)
Speech Language Pathology Evaluation Patient Details Name: Ryan Adams MRN: [id number] DOB: July 07, 1927 Today's Date: 03/28/2020 Time: 4496-7591 SLP Time Calculation (min) (ACUTE ONLY): 13 min  Problem List:  Patient Active Problem List   Diagnosis Date Noted   ICH (intracerebral hemorrhage) ([place]) 03/27/2020   Past Medical History: No past medical history on file.  HPI:  Pt is 86 year old man with past history of diabetes, hypertension, with sudden onset of confusion.  On examination revealed dysarthria, expressive aphasia with no focal motor or sensory deficits. 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    Assessment / Plan / Recommendation Clinical Impression  Pt was seen for SLE and his participation was limited. He demonstrates a global aphasia with impairments to both verbal expression and auditory comprehension. He demonstrated intermittent initiation of verbal expression, but was observed to produce a few basic social responses. His auditory comprehension was also impaired as he was unable to answer basic biographical yes/no questions with adequate accuracy. He became increasingly lethargic throughout the session and participation became further limited. SLP will continue to f/u acutely to address cognition and language.     SLP Assessment  SLP Recommendation/Assessment: Patient needs continued Speech Lanaguage Pathology Services SLP Visit Diagnosis: Aphasia (R47.01)    Follow Up Recommendations       Frequency and Duration min 2x/week  2 weeks      SLP Evaluation Cognition  Overall Cognitive Status: Impaired/Different from baseline Arousal/Alertness: Lethargic Orientation Level: Disoriented X4 Attention: Focused;Sustained Focused Attention: Impaired Focused Attention Impairment: Verbal basic;Functional basic Sustained Attention: Impaired Sustained Attention Impairment: Verbal basic;Functional basic       Comprehension  Auditory  Comprehension Overall Auditory Comprehension: Impaired Yes/No Questions: Impaired Basic Biographical Questions: 51-75% accurate Visual Recognition/Discrimination Discrimination: Not tested Reading Comprehension Reading Status: Not tested    Expression Expression Primary Mode of Expression: Verbal Verbal Expression Overall Verbal Expression: Impaired Initiation: Impaired Level of Generative/Spontaneous Verbalization: Word   Oral / Motor  Oral Motor/Sensory Function Overall Oral Motor/Sensory Function: Within functional limits   GO                    Greggory Keen 03/28/2020, 10:57 AM

## 2020-03-28 NOTE — Progress Notes (Signed)
Patient confused, extremely restless, disrobing and removing all equipment despite mitts and restraints. Redirected frequently with no effect. Dr. Erlinda Hong aware, MRI changed to CT. Haldol PRN administered.   1050 Haldol given with minimal effect. Patient remains uncooperative, requiring three staff members to assist with bed change and safety measures.  Dr. Erlinda Hong at bedside. Ativan X1 ordered. Given with + effect. CT completed without incident.

## 2020-03-28 NOTE — Progress Notes (Addendum)
OT Cancellation Note  Patient Details Name: Ryan Adams MRN: [id number] DOB: [birth year]   Cancelled Treatment:    Reason Eval/Treat Not Completed: Active bedrest order, per MD increased agitation today. Will follow and await for updated activity orders.  Lou Cal, OT Acute Rehabilitation Services Pager [phone] Office [phone]   Raymondo Band 03/28/2020, 7:57 AM

## 2020-03-28 NOTE — ED Notes (Signed)
Report to Caroline, RN.

## 2020-03-29 DIAGNOSIS — I611 Nontraumatic intracerebral hemorrhage in hemisphere, cortical: Secondary | ICD-10-CM | POA: Diagnosis not present

## 2020-03-29 LAB — BASIC METABOLIC PANEL
Anion gap: 11 (ref 5–15)
BUN: 17 mg/dL (ref 8–23)
CO2: 22 mmol/L (ref 22–32)
Calcium: 8.7 mg/dL — ABNORMAL LOW (ref 8.9–10.3)
Chloride: 103 mmol/L (ref 98–111)
Creatinine, Ser: 0.94 mg/dL (ref 0.61–1.24)
GFR, Estimated: >60 mL/min (ref >60)
Glucose, Bld: 93 mg/dL (ref 70–99)
Potassium: 3.3 mmol/L — ABNORMAL LOW (ref 3.5–5.1)
Sodium: 136 mmol/L (ref 135–145)

## 2020-03-29 LAB — GLUCOSE, CAPILLARY
Glucose-Capillary: 148 mg/dL — ABNORMAL HIGH (ref 70–99)
Glucose-Capillary: 73 mg/dL (ref 70–99)
Glucose-Capillary: 87 mg/dL (ref 70–99)

## 2020-03-29 LAB — CBC
HCT: 32.1 % — ABNORMAL LOW (ref 39.0–52.0)
Hemoglobin: 10.2 g/dL — ABNORMAL LOW (ref 13.0–17.0)
MCH: 24.5 pg — ABNORMAL LOW (ref 26.0–34.0)
MCHC: 31.8 g/dL (ref 30.0–36.0)
MCV: 77.2 fL — ABNORMAL LOW (ref 80.0–100.0)
Platelets: 195 10*3/uL (ref 150–400)
RBC: 4.16 MIL/uL — ABNORMAL LOW (ref 4.22–5.81)
RDW: 15.2 % (ref 11.5–15.5)
WBC: 6.6 10*3/uL (ref 4.0–10.5)
nRBC: 0 % (ref 0.0–0.2)

## 2020-03-29 LAB — IRON AND TIBC
Iron: 79 ug/dL (ref 45–182)
Saturation Ratios: 25 % (ref 17.9–39.5)
TIBC: 318 ug/dL (ref 250–450)
UIBC: 239 ug/dL

## 2020-03-29 LAB — FERRITIN: Ferritin: 35 ng/mL (ref 24–336)

## 2020-03-29 MED ORDER — QUETIAPINE FUMARATE 25 MG PO TABS
25.0000 mg | ORAL_TABLET | Freq: Two times a day (BID) | ORAL | Status: DC
  Administered 2020-03-29 – 2020-04-03 (×10): 25 mg via ORAL
  Filled 2020-03-29 (×11): qty 1

## 2020-03-29 MED ORDER — POTASSIUM CHLORIDE CRYS ER 20 MEQ PO TBCR: 40.0000 meq | EXTENDED_RELEASE_TABLET | ORAL | Status: DC

## 2020-03-29 MED ORDER — INSULIN ASPART 100 UNIT/ML ~~LOC~~ SOLN: 0.0000 [IU] | Freq: Every day | SUBCUTANEOUS | Status: DC

## 2020-03-29 MED ORDER — METOPROLOL TARTRATE 50 MG PO TABS: 50.0000 mg | ORAL_TABLET | Freq: Every day | ORAL | Status: DC

## 2020-03-29 MED ORDER — METOPROLOL TARTRATE 25 MG PO TABS
25.0000 mg | ORAL_TABLET | Freq: Two times a day (BID) | ORAL | Status: DC
  Administered 2020-03-29 – 2020-04-03 (×10): 25 mg via ORAL
  Filled 2020-03-29 (×11): qty 1

## 2020-03-29 MED ORDER — QUETIAPINE FUMARATE 25 MG PO TABS: 25.0000 mg | ORAL_TABLET | Freq: Every day | ORAL | Status: DC

## 2020-03-29 MED ORDER — POTASSIUM CHLORIDE 20 MEQ PO PACK
40.0000 meq | PACK | Freq: Once | ORAL | Status: AC
  Administered 2020-03-29: 40 meq via ORAL
  Filled 2020-03-29: qty 2

## 2020-03-29 MED ORDER — PANTOPRAZOLE SODIUM 40 MG PO PACK
40.0000 mg | PACK | Freq: Every day | ORAL | Status: DC
  Administered 2020-03-29 – 2020-04-03 (×6): 40 mg via ORAL
  Filled 2020-03-29 (×6): qty 20

## 2020-03-29 MED ORDER — INSULIN ASPART 100 UNIT/ML ~~LOC~~ SOLN
0.0000 [IU] | Freq: Three times a day (TID) | SUBCUTANEOUS | Status: DC
  Administered 2020-03-29: 1 [IU] via SUBCUTANEOUS
  Administered 2020-03-31: 2 [IU] via SUBCUTANEOUS
  Administered 2020-04-03: 1 [IU] via SUBCUTANEOUS

## 2020-03-29 MED ORDER — FERROUS SULFATE 325 (65 FE) MG PO TABS
325.0000 mg | ORAL_TABLET | Freq: Two times a day (BID) | ORAL | Status: DC
  Administered 2020-03-29 – 2020-04-03 (×9): 325 mg via ORAL
  Filled 2020-03-29 (×9): qty 1

## 2020-03-29 MED ORDER — POTASSIUM CHLORIDE 20 MEQ/15ML (10%) PO SOLN
40.0000 meq | ORAL | Status: DC
  Administered 2020-03-29: 40 meq via ORAL
  Filled 2020-03-29 (×2): qty 30

## 2020-03-29 MED ORDER — PANTOPRAZOLE SODIUM 40 MG PO PACK: 40.0000 mg | PACK | Freq: Every day | ORAL | Status: DC

## 2020-03-29 MED ORDER — LABETALOL HCL 5 MG/ML IV SOLN
5.0000 mg | INTRAVENOUS | Status: DC | PRN
  Administered 2020-03-30: 10 mg via INTRAVENOUS
  Administered 2020-04-01: 5 mg via INTRAVENOUS
  Administered 2020-04-02 – 2020-04-03 (×2): 15 mg via INTRAVENOUS
  Filled 2020-03-29 (×4): qty 4

## 2020-03-29 MED ORDER — BENAZEPRIL HCL 20 MG PO TABS
20.0000 mg | ORAL_TABLET | Freq: Every day | ORAL | Status: DC
  Administered 2020-03-29: 20 mg via ORAL
  Filled 2020-03-29 (×2): qty 1

## 2020-03-29 NOTE — Evaluation (Signed)
Occupational Therapy Evaluation Patient Details Name: Ryan Adams MRN: [id number] DOB: 06-Jun-1927 Today's Date: 03/29/2020    History of Present Illness Pt is 86 year old male with past history of diabetes, hypertension, with sudden onset of confusion.  On examination revealed dysarthria, expressive aphasia with no focal motor or sensory deficits. 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     Clinical Impression   This 86 y/o male presents with the above. PTA pt living alone and reports being independent with ADL and mobility tasks, was driving. Pt currently presenting with the above and below listed deficits including impaired cognition, poor balance strategies, generalized weakness. Pt requiring two person assist for safe completion of short distance mobility in room using RW, requiring up to maxA (+2) for ADL tasks. Pt to benefit from continued acute OT services and currently recommend post acute rehab services after discharge to maximize his overall safety and independence with ADL and mobility.      Follow Up Recommendations  SNF;Supervision/Assistance - 24 hour    Equipment Recommendations  Other (comment);3 in 1 bedside commode (TBD)           Precautions / Restrictions Precautions Precautions: Fall Restrictions Weight Bearing Restrictions: No      Mobility Bed Mobility Overal bed mobility: Needs Assistance Bed Mobility: Supine to Sit     Supine to sit: Min assist     General bed mobility comments: assist for trunk    Transfers Overall transfer level: Needs assistance Equipment used: Rolling walker (2 wheeled) Transfers: Sit to/from Stand Sit to Stand: Mod assist;+2 physical assistance;+2 safety/equipment         General transfer comment: requires boosting and steadying assist; pt with posterior bias/bracing LEs against bed     Balance Overall balance assessment: Needs assistance Sitting-balance support: Feet  supported Sitting balance-Leahy Scale: Fair     Standing balance support: Single extremity supported;Bilateral upper extremity supported Standing balance-Leahy Scale: Poor Standing balance comment: reliant on UE support                            ADL either performed or assessed with clinical judgement   ADL Overall ADL's : Needs assistance/impaired Eating/Feeding: Minimal assistance;Sitting   Grooming: Minimal assistance;Sitting   Upper Body Bathing: Minimal assistance;Sitting   Lower Body Bathing: Maximal assistance;+2 for physical assistance;Sit to/from stand   Upper Body Dressing : Moderate assistance;Sitting   Lower Body Dressing: Maximal assistance;+2 for safety/equipment;+2 for physical assistance;Sit to/from stand   Toilet Transfer: Moderate assistance;Maximal assistance;+2 for physical assistance;+2 for safety/equipment;Ambulation;RW   Toileting- Clothing Manipulation and Hygiene: Maximal assistance;+2 for physical assistance;+2 for safety/equipment;Sit to/from stand Toileting - Clothing Manipulation Details (indicate cue type and reason): pt's condom cath leaking requirirng assist for pericare      Functional mobility during ADLs: Moderate assistance;Maximal assistance;+2 for physical assistance;+2 for safety/equipment;Rolling walker       Vision         Perception     Praxis      Pertinent Vitals/Pain Pain Assessment: No/denies pain     Hand Dominance Right   Extremity/Trunk Assessment Upper Extremity Assessment Upper Extremity Assessment: Generalized weakness;LUE deficits/detail LUE Deficits / Details: LUE appears grossly weaker than RUE    Lower Extremity Assessment Lower Extremity Assessment: Defer to PT evaluation       Communication Communication Communication: HOH   Cognition Arousal/Alertness: Awake/alert Behavior During Therapy: Flat affect Overall Cognitive Status: Impaired/Different from baseline Area of Impairment:  Attention;Memory;Following commands;Awareness;Problem solving;Orientation                 Orientation Level: Disoriented to;Place ("we're not in the hospital" when given choices ) Current Attention Level: Sustained Memory: Decreased short-term memory Following Commands: Follows one step commands inconsistently;Follows one step commands with increased time   Awareness: Intellectual Problem Solving: Slow processing;Decreased initiation;Requires verbal cues;Requires tactile cues General Comments: pt's cognition almost appeared to regress as session went on - answering home setup questions accurately per grandaughter confirmation - but once mobilizing with poor command follow and awareness    General Comments       Exercises     Shoulder Instructions      Home Living Family/patient expects to be discharged to:: Private residence Living Arrangements: Alone Available Help at Discharge: Family Type of Home: Apartment Home Access: Stairs to enter Technical brewer of Steps: 1   Home Layout: One level     Bathroom Shower/Tub: Occupational psychologist: Standard     Home Equipment: None          Prior Functioning/Environment Level of Independence: Independent        Comments: performs basic iADL        OT Problem List: Decreased strength;Decreased range of motion;Decreased activity tolerance;Impaired balance (sitting and/or standing);Decreased safety awareness;Decreased knowledge of use of DME or AE;Decreased knowledge of precautions;Decreased cognition;Decreased coordination      OT Treatment/Interventions: Self-care/ADL training;Therapeutic exercise;Energy conservation;DME and/or AE instruction;Therapeutic activities;Cognitive remediation/compensation;Patient/family education;Balance training;Visual/perceptual remediation/compensation    OT Goals(Current goals can be found in the care plan section) Acute Rehab OT Goals Patient Stated Goal: agreeable  to working with therapy OT Goal Formulation: With patient Time For Goal Achievement: 2020-05-07 Potential to Achieve Goals: Good  OT Frequency: Min 2X/week   Barriers to D/C:            Co-evaluation PT/OT/SLP Co-Evaluation/Treatment: Yes Reason for Co-Treatment: Necessary to address cognition/behavior during functional activity;For patient/therapist safety;To address functional/ADL transfers   OT goals addressed during session: ADL's and self-care      AM-PAC OT "6 Clicks" Daily Activity     Outcome Measure Help from another person eating meals?: A Little Help from another person taking care of personal grooming?: A Little Help from another person toileting, which includes using toliet, bedpan, or urinal?: A Lot Help from another person bathing (including washing, rinsing, drying)?: A Lot Help from another person to put on and taking off regular upper body clothing?: A Lot Help from another person to put on and taking off regular lower body clothing?: A Lot 6 Click Score: 14   End of Session Equipment Utilized During Treatment: Surveyor, mining Communication: Mobility status  Activity Tolerance: Patient tolerated treatment well Patient left: in chair;with call bell/phone within reach;with chair alarm set;with nursing/sitter in room  OT Visit Diagnosis: Unsteadiness on feet (R26.81);Muscle weakness (generalized) (M62.81);Other symptoms and signs involving cognitive function                Time: 2841-3244 OT Time Calculation (min): 29 min Charges:  OT General Charges $OT Visit: 1 Visit OT Evaluation $OT Eval Moderate Complexity: Johnson, OT Acute Rehabilitation Services Pager [phone] Office [phone]  Raymondo Band 03/29/2020, 6:05 PM

## 2020-03-29 NOTE — Progress Notes (Addendum)
STROKE TEAM PROGRESS NOTE   SUBJECTIVE (INTERVAL HISTORY) His RN and granddaughter are at the bedside.  Pt initially sleeping but easily arousable and orientated to self and age but not to time. Moving all extremities. Seems to have some weakness of LLE but not sure if baseline or cognitive impairment with difficulty following commands. I talked with son over the phone.   OBJECTIVE Temp:  [97.6 F (36.4 C)-98 F (36.7 C)] 97.7 F (36.5 C) (11/17 0735) Pulse Rate:  [60-120] 84 (11/17 0920) Resp:  [10-32] 21 (11/17 0920) BP: (90-185)/(56-119) 123/84 (11/17 0920) SpO2:  [95 %-100 %] 100 % (11/17 0920) Weight:  [50.9 kg] 50.9 kg (11/17 0900)  Recent Labs  Lab 03/27/20 1911  GLUCAP 89   Recent Labs  Lab 03/27/20 2105 03/29/20 0047  NA 132* 136  K 3.9 3.3*  CL 97* 103  CO2 25 22  GLUCOSE 104* 93  BUN 22 17  CREATININE 1.29* 0.94  CALCIUM 8.9 8.7*   Recent Labs  Lab 03/27/20 2105  AST 31  ALT 16  ALKPHOS 46  BILITOT 0.8  PROT 6.3*  ALBUMIN 3.9   Recent Labs  Lab 03/27/20 2105 03/29/20 0047  WBC 6.7 6.6  HGB 10.3* 10.2*  HCT 32.5* 32.1*  MCV 78.1* 77.2*  PLT 190 195   No results for input(s): CKTOTAL, CKMB, CKMBINDEX, TROPONINI in the last 168 hours. Recent Labs    03/27/20 2105  LABPROT 13.9  INR 1.1   Recent Labs    03/27/20 1906  COLORURINE YELLOW  LABSPEC 1.012  PHURINE 7.0  GLUCOSEU NEGATIVE  HGBUR NEGATIVE  BILIRUBINUR NEGATIVE  KETONESUR 5*  PROTEINUR 30*  NITRITE NEGATIVE  LEUKOCYTESUR NEGATIVE       Component Value Date/Time   CHOL 250 (H) 03/28/2020 0558   TRIG 108 03/28/2020 0558   HDL 57 03/28/2020 0558   CHOLHDL 4.4 03/28/2020 0558   VLDL 22 03/28/2020 0558   LDLCALC 171 (H) 03/28/2020 0558   Lab Results  Component Value Date   HGBA1C 6.3 (H) 03/28/2020      Component Value Date/Time   LABOPIA NONE DETECTED 03/27/2020 2005   COCAINSCRNUR NONE DETECTED 03/27/2020 2005   LABBENZ NONE DETECTED 03/27/2020 2005   AMPHETMU  NONE DETECTED 03/27/2020 2005   THCU NONE DETECTED 03/27/2020 2005   LABBARB NONE DETECTED 03/27/2020 2005    Recent Labs  Lab 03/27/20 2105  ETH <10    I have personally reviewed the radiological images below and agree with the radiology interpretations.  CT ANGIO HEAD W OR WO CONTRAST  Result Date: 03/28/2020 CLINICAL DATA:  Follow-up parenchymal hemorrhage EXAM: CT ANGIOGRAPHY HEAD AND NECK TECHNIQUE: Multidetector CT imaging of the head and neck was performed using the standard protocol during bolus administration of intravenous contrast. Multiplanar CT image reconstructions and MIPs were obtained to evaluate the vascular anatomy. Carotid stenosis measurements (when applicable) are obtained utilizing NASCET criteria, using the distal internal carotid diameter as the denominator. CONTRAST:  11mL OMNIPAQUE IOHEXOL 350 MG/ML SOLN COMPARISON:  Head CT from yesterday FINDINGS: CT HEAD FINDINGS Brain: Parenchymal hemorrhage in the posterior left temporal lobe, extending to the occipital lobe, with rim of edema. The shape is unchanged and dimensions are also essentially stable at up to 35 x 18 mm on axial slices. Cerebral volume loss in keeping with age. Vascular: See below Skull: Normal. Negative for fracture or focal lesion. Sinuses: Imaged portions are clear. Orbits: Cataract resection and scleral band on the right. Review of the  MIP images confirms the above findings CTA NECK FINDINGS Aortic arch: No acute finding where covered. Three vessel branching. Right carotid system: Mild atheromatous plaque about the bifurcation, especially for age. No stenosis or ulceration. Left carotid system: Mild atheromatous plaque about the bifurcation, especially for age. No stenosis or ulceration. Vertebral arteries: No proximal subclavian or brachiocephalic stenosis. Codominant vertebral arteries which are tortuous but smoothly contoured. No vertebral stenosis. Skeleton: Diffuse degenerative disc disease with disc  collapse and endplate erosion. Heterogeneous bony density without discrete lesion, likely osteopenic. A similar appearance was seen on the 2020 abdominal and lower extremity CT. Other neck: No acute finding. Upper chest: No acute finding Review of the MIP images confirms the above findings CTA HEAD FINDINGS Anterior circulation: Atheromatous calcification of the carotid siphons which is mild. No branch occlusion, beading, or aneurysm. No abnormal enhancement at the level of the hematoma. Posterior circulation: The vertebral and basilar arteries are smooth and widely patent. Incidental proximal basilar fenestration. No branch occlusion, beading, or aneurysm. No abnormal enhancement at the level of the hematoma. Venous sinuses: Diffusely patent Anatomic variants: As above Review of the MIP images confirms the above findings IMPRESSION: 1. No evidence of vascular lesion or mass underlying the patient's parenchymal hemorrhage. The hematoma is nonprogressive from yesterday. 2. Mild for age atherosclerosis. Electronically Signed   By: Monte Fantasia M.D.   On: 03/28/2020 11:19   CT HEAD WO CONTRAST  Result Date: 03/27/2020 CLINICAL DATA:  86 year old male with altered mental status. EXAM: CT HEAD WITHOUT CONTRAST TECHNIQUE: Contiguous axial images were obtained from the base of the skull through the vertex without intravenous contrast. COMPARISON:  None. FINDINGS: Brain: Left posterior temporal lobe intraparenchymal hemorrhage measures approximately 3.4 x 1.8 cm in greatest axial dimensions and 2.3 cm in craniocaudal length. There is mild surrounding edema. There is mild age-related atrophy and chronic microvascular ischemic changes. There is no midline shift. No extra-axial fluid collection. Vascular: No hyperdense vessel or unexpected calcification. Skull: Normal. Negative for fracture or focal lesion. Sinuses/Orbits: No acute finding. Other: None IMPRESSION: 1. 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 Further evaluation with MRI recommended. No midline shift. 2. Mild chronic microvascular ischemic changes. These results were called by telephone at the time of interpretation on 03/27/2020 at 7:34 pm to provider Carmin Muskrat , who verbally acknowledged these results. Electronically Signed   By: Anner Crete M.D.   On: 03/27/2020 19:37   CT ANGIO NECK W OR WO CONTRAST  Result Date: 03/28/2020 CLINICAL DATA:  Follow-up parenchymal hemorrhage EXAM: CT ANGIOGRAPHY HEAD AND NECK TECHNIQUE: Multidetector CT imaging of the head and neck was performed using the standard protocol during bolus administration of intravenous contrast. Multiplanar CT image reconstructions and MIPs were obtained to evaluate the vascular anatomy. Carotid stenosis measurements (when applicable) are obtained utilizing NASCET criteria, using the distal internal carotid diameter as the denominator. CONTRAST:  25mL OMNIPAQUE IOHEXOL 350 MG/ML SOLN COMPARISON:  Head CT from yesterday FINDINGS: CT HEAD FINDINGS Brain: Parenchymal hemorrhage in the posterior left temporal lobe, extending to the occipital lobe, with rim of edema. The shape is unchanged and dimensions are also essentially stable at up to 35 x 18 mm on axial slices. Cerebral volume loss in keeping with age. Vascular: See below Skull: Normal. Negative for fracture or focal lesion. Sinuses: Imaged portions are clear. Orbits: Cataract resection and scleral band on the right. Review of the MIP images confirms the above findings CTA NECK FINDINGS Aortic  arch: No acute finding where covered. Three vessel branching. Right carotid system: Mild atheromatous plaque about the bifurcation, especially for age. No stenosis or ulceration. Left carotid system: Mild atheromatous plaque about the bifurcation, especially for age. No stenosis or ulceration. Vertebral arteries: No proximal subclavian or brachiocephalic stenosis.  Codominant vertebral arteries which are tortuous but smoothly contoured. No vertebral stenosis. Skeleton: Diffuse degenerative disc disease with disc collapse and endplate erosion. Heterogeneous bony density without discrete lesion, likely osteopenic. A similar appearance was seen on the 2020 abdominal and lower extremity CT. Other neck: No acute finding. Upper chest: No acute finding Review of the MIP images confirms the above findings CTA HEAD FINDINGS Anterior circulation: Atheromatous calcification of the carotid siphons which is mild. No branch occlusion, beading, or aneurysm. No abnormal enhancement at the level of the hematoma. Posterior circulation: The vertebral and basilar arteries are smooth and widely patent. Incidental proximal basilar fenestration. No branch occlusion, beading, or aneurysm. No abnormal enhancement at the level of the hematoma. Venous sinuses: Diffusely patent Anatomic variants: As above Review of the MIP images confirms the above findings IMPRESSION: 1. No evidence of vascular lesion or mass underlying the patient's parenchymal hemorrhage. The hematoma is nonprogressive from yesterday. 2. Mild for age atherosclerosis. Electronically Signed   By: Monte Fantasia M.D.   On: 03/28/2020 11:19   MR BRAIN W WO CONTRAST  Result Date: 03/28/2020 CLINICAL DATA:  Cerebral parenchymal hemorrhage follow-up. EXAM: MRI HEAD WITHOUT AND WITH CONTRAST TECHNIQUE: Multiplanar, multiecho pulse sequences of the brain and surrounding structures were obtained without and with intravenous contrast. CONTRAST:  65mL GADAVIST GADOBUTROL 1 MMOL/ML IV SOLN COMPARISON:  CT head 03/27/2020 FINDINGS: Brain: Acute hematoma left temporoparietal lobe unchanged in size measuring approximately 19 x 40 mm. Moderate surrounding edema. Slight peripheral enhancement postcontrast infusion. No evidence of underlying mass lesion. Several additional areas of chronic microhemorrhage in both cerebral hemispheres. Approximately  5 additional areas of chronic microhemorrhage. Mild atrophy. Negative for acute ischemic infarct. Mild white matter changes. No mass lesion identified. Vascular: Normal arterial flow voids. Skull and upper cervical spine: No focal skeletal lesion. Sinuses/Orbits: Paranasal sinuses clear. Ocular surgery on the right. No orbital mass. Other: None IMPRESSION: Acute hematoma left temporoparietal lobe unchanged with surrounding edema. 5 additional foci of chronic microhemorrhage in the brain. 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 No acute ischemic infarct. Mild chronic microvascular ischemic changes in the white matter. Electronically Signed   By: Franchot Gallo M.D.   On: 03/28/2020 13:25   DG Chest Port 1 View  Result Date: 03/27/2020 CLINICAL DATA:  Change in mental status EXAM: PORTABLE CHEST 1 VIEW COMPARISON:  November 10, 2008 FINDINGS: The heart size and mediastinal contours are within normal limits. Aortic knob calcifications are seen. Both lungs are clear. The visualized skeletal structures are unremarkable. IMPRESSION: No active disease. Electronically Signed   By: Prudencio Pair M.D.   On: 03/27/2020 19:25   ECHOCARDIOGRAM COMPLETE  Result Date: 03/28/2020    ECHOCARDIOGRAM REPORT   Patient Name:   Ryan Adams Date of Exam: 03/28/2020 Medical Rec #:  [id number]      Height:       72.0 in Accession #:    [id number]     Weight:       126.5 lb Date of Birth:  February 04, 1928       BSA:          1.754 m Patient Age:    86 years  BP:           119/87 mmHg Patient Gender: M              HR:           84 bpm. Exam Location:  Inpatient Procedure: 2D Echo, Cardiac Doppler and Color Doppler Indications:    Stroke 434.91 / I163.9  History:        Patient has no prior history of Echocardiogram examinations.  Sonographer:    Vickie Epley RDCS Referring Phys: [id number] ASHISH ARORA  Sonographer Comments: Technically difficult study due to poor echo windows. Image acquisition challenging due to patient  body habitus. IMPRESSIONS  1. Left ventricular ejection fraction, by estimation, is 60 to 65%. The left ventricle has normal function. Left ventricular endocardial border not optimally defined to evaluate regional wall motion. There is moderate asymmetric left ventricular hypertrophy of the basal-septal segment. Left ventricular diastolic parameters are indeterminate.  2. Right ventricular systolic function is normal. The right ventricular size is normal. There is normal pulmonary artery systolic pressure. The estimated right ventricular systolic pressure is 32.2 mmHg.  3. The mitral valve is normal in structure. Trivial mitral valve regurgitation. No evidence of mitral stenosis. Moderate mitral annular calcification.  4. The aortic valve is tricuspid. Aortic valve regurgitation is trivial. No aortic stenosis is present by visual assessment, Doppler alignment is suboptimal.  5. Aortic dilatation noted. There is mild dilatation of the aortic root, measuring 42 mm.  6. The inferior vena cava is normal in size with greater than 50% respiratory variability, suggesting right atrial pressure of 3 mmHg. Conclusion(s)/Recommendation(s): No intracardiac source of embolism detected on this transthoracic study. A transesophageal echocardiogram is recommended to exclude cardiac source of embolism if clinically indicated. FINDINGS  Left Ventricle: Left ventricular ejection fraction, by estimation, is 60 to 65%. The left ventricle has normal function. Left ventricular endocardial border not optimally defined to evaluate regional wall motion. The left ventricular internal cavity size was normal in size. There is moderate asymmetric left ventricular hypertrophy of the basal-septal segment. Left ventricular diastolic parameters are indeterminate. Right Ventricle: The right ventricular size is normal. No increase in right ventricular wall thickness. Right ventricular systolic function is normal. There is normal pulmonary artery  systolic pressure. The tricuspid regurgitant velocity is 2.58 m/s, and  with an assumed right atrial pressure of 3 mmHg, the estimated right ventricular systolic pressure is 02.5 mmHg. Left Atrium: Left atrial size was not well visualized. Right Atrium: Right atrial size was not well visualized. Pericardium: Trivial pericardial effusion is present. Mitral Valve: The mitral valve is normal in structure. Moderate mitral annular calcification. Trivial mitral valve regurgitation. No evidence of mitral valve stenosis. Tricuspid Valve: The tricuspid valve is normal in structure. Tricuspid valve regurgitation is mild . No evidence of tricuspid stenosis. Aortic Valve: The aortic valve is tricuspid. Aortic valve regurgitation is trivial. No aortic stenosis is present. Pulmonic Valve: The pulmonic valve was not well visualized. Pulmonic valve regurgitation is not visualized. No evidence of pulmonic stenosis. Aorta: Aortic dilatation noted and the ascending aorta was not well visualized. There is mild dilatation of the aortic root, measuring 42 mm. Venous: The inferior vena cava is normal in size with greater than 50% respiratory variability, suggesting right atrial pressure of 3 mmHg. IAS/Shunts: The interatrial septum was not well visualized.  LEFT VENTRICLE PLAX 2D LVIDd:         4.80 cm  Diastology LVIDs:         3.50  cm  LV e' medial:    4.29 cm/s LV PW:         0.80 cm  LV E/e' medial:  14.8 LV IVS:        0.80 cm  LV e' lateral:   5.87 cm/s LVOT diam:     2.10 cm  LV E/e' lateral: 10.8 LV SV:         42 LV SV Index:   24 LVOT Area:     3.46 cm  RIGHT VENTRICLE RV S prime:     23.30 cm/s TAPSE (M-mode): 2.4 cm LEFT ATRIUM           Index       RIGHT ATRIUM           Index LA diam:      4.40 cm 2.51 cm/m  RA Area:     14.40 cm LA Vol (A4C): 33.1 ml 18.87 ml/m RA Volume:   35.80 ml  20.41 ml/m  AORTIC VALVE LVOT Vmax:   54.40 cm/s LVOT Vmean:  38.800 cm/s LVOT VTI:    0.121 m  AORTA Ao Root diam: 4.20 cm MITRAL VALVE                TRICUSPID VALVE MV Area (PHT): 3.37 cm    TR Peak grad:   26.6 mmHg MV Decel Time: 225 msec    TR Vmax:        258.00 cm/s MV E velocity: 63.30 cm/s MV A velocity: 77.10 cm/s  SHUNTS MV E/A ratio:  0.82        Systemic VTI:  0.12 m                            Systemic Diam: 2.10 cm Cherlynn Kaiser MD Electronically signed by Cherlynn Kaiser MD Signature Date/Time: 03/28/2020/9:50:42 AM    Final     PHYSICAL EXAM  Temp:  [97.6 F (36.4 C)-98 F (36.7 C)] 97.7 F (36.5 C) (11/17 0735) Pulse Rate:  [60-120] 84 (11/17 0920) Resp:  [10-32] 21 (11/17 0920) BP: (90-185)/(56-119) 123/84 (11/17 0920) SpO2:  [95 %-100 %] 100 % (11/17 0920) Weight:  [50.9 kg] 50.9 kg (11/17 0900)  General - Well nourished, well developed, in no apparent distress.  Ophthalmologic - fundi not visualized due to noncooperation.  Cardiovascular - Regular rhythm and rate.  Neuro - awake alert, eyes open, able to answer his name and age, but not orientated to time. Able to following simple one step commands but not two-step commands,Difficult checking visual field, but blinking to visual threat bilaterally, able to track both sides. Able to gaze bilaterally. No facial droop. PERRL. Tongue protrusion midline. Moving BUEs, symmetric, no drift. RLE able to hold without drift but LLE drift but not hitting bed within 5 sec. Sensation symmetric, coordination no gross deficit but not able to follow two-step commands well. Gait not tested.  ASSESSMENT/PLAN Ryan Adams is a 86 y.o. male with history of diabetes and hypertension admitted for confusion, aphasia. No tPA given due to [place].    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  Chronic microhemorrhages in the brain suggesting CAA versus hypertension.  No acute infarct.  CT head and neck unremarkable  2D Echo EF 60 to 65%  LDL 171  HgbA1c 6.3  Heparin subcu for  VTE prophylaxis  No antithrombotic prior to  admission, now on No antithrombotic due to [place]  Ongoing aggressive stroke risk factor management  Therapy recommendations: Pending  Disposition: Pending  Diabetes  HgbA1c 6.3 goal < 7.0  Controlled  CBG monitoring  SSI  DM education and close PCP follow up  Hypertension . Home meds - amlodipine, lotensin and metoprolol . Stable . On low-dose Cleviprex, taper off as able . Resume home lotensin and metoprolol  Long term BP goal normotensive  Hyperlipidemia  Home meds: None  LDL 171, goal < 70  Consider statin at discharge  Agitation  Likely due to [place] in the setting of underlying cognitive impairment  Haldol and Ativanas needed  Seroquel 25mg  bid  Other Stroke Risk Factors  Advanced age  Other Active Problems  Hyponatremia, sodium 132->136  Microcytic anemia hemoglobin 10.3->10.2 - on iron pills  Hospital day # 2  This patient is critically ill due to [place], agitation, HTN and at significant risk of neurological worsening, death form hematoma expansion, cerebral edema, seizure. This patient's care requires constant monitoring of vital signs, hemodynamics, respiratory and cardiac monitoring, review of multiple databases, neurological assessment, discussion with family, other specialists and medical decision making of high complexity. I spent 40 minutes of neurocritical care time in the care of this patient. I had long discussion with Son Ryan Adams at bedside, updated pt current condition, treatment plan and potential prognosis, and answered all the questions. He expressed understanding and appreciation.    Rosalin Hawking, MD PhD Stroke Neurology 03/29/2020 10:22 AM    To contact Stroke Continuity provider, please refer to [email or web address] After hours, contact General Neurology

## 2020-03-29 NOTE — Evaluation (Signed)
Physical Therapy Evaluation Patient Details Name: Ryan Adams MRN: [id number] DOB: [birth year] Today's Date: 03/29/2020   History of Present Illness  Pt is 86 year old male with past history of diabetes, hypertension, with sudden onset of confusion.  On examination revealed dysarthria, expressive aphasia with no focal motor or sensory deficits. 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    Clinical Impression  Pt admitted with/for sudden onset of confusion and found to have L temporal hemorrhage.  Pt needing from min to max assist of 2 persons at times for basic mobility and gait.Marland Kitchen  Pt currently limited functionally due to the problems listed. ( See problems list.)   Pt will benefit from PT to maximize function and safety in order to get ready for next venue listed below.     Follow Up Recommendations SNF;Supervision/Assistance - 24 hour    Equipment Recommendations  Rolling walker with 5" wheels;Other (comment) (TBA next venue)    Recommendations for Other Services       Precautions / Restrictions Precautions Precautions: Fall Restrictions Weight Bearing Restrictions: No      Mobility  Bed Mobility Overal bed mobility: Needs Assistance Bed Mobility: Supine to Sit     Supine to sit: Min assist     General bed mobility comments: assist for trunk    Transfers Overall transfer level: Needs assistance Equipment used: Rolling walker (2 wheeled) Transfers: Sit to/from Stand Sit to Stand: Mod assist;+2 physical assistance;+2 safety/equipment         General transfer comment: requires boosting and steadying assist; pt with posterior bias/bracing LEs against bed   Ambulation/Gait Ambulation/Gait assistance: Max assist;+2 physical assistance Gait Distance (Feet): 15 Feet Assistive device: Rolling walker (2 wheeled) Gait Pattern/deviations: Step-through pattern;Decreased step length - right;Decreased step length - left;Decreased stride  length Gait velocity: slow Gait velocity interpretation: <1.31 ft/sec, indicative of household ambulator General Gait Details: unsteady steps with scissoring, staggering.  Cues for upright posture, proximity to the RW and spreading his stance.  Stairs            Wheelchair Mobility    Modified Rankin (Stroke Patients Only) Modified Rankin (Stroke Patients Only) Pre-Morbid Rankin Score: No symptoms Modified Rankin: Moderately severe disability     Balance Overall balance assessment: Needs assistance Sitting-balance support: Feet supported Sitting balance-Leahy Scale: Fair     Standing balance support: Single extremity supported;Bilateral upper extremity supported Standing balance-Leahy Scale: Poor Standing balance comment: reliant on UE support                              Pertinent Vitals/Pain Pain Assessment: No/denies pain    Home Living Family/patient expects to be discharged to:: Private residence Living Arrangements: Alone Available Help at Discharge: Family Type of Home: Apartment Home Access: Stairs to enter   Technical brewer of Steps: 1 Home Layout: One level Home Equipment: None      Prior Function Level of Independence: Independent         Comments: performs basic iADL     Hand Dominance   Dominant Hand: Right    Extremity/Trunk Assessment   Upper Extremity Assessment Upper Extremity Assessment: Defer to OT evaluation LUE Deficits / Details: LUE appears grossly weaker than RUE     Lower Extremity Assessment Lower Extremity Assessment: Generalized weakness       Communication   Communication: HOH  Cognition Arousal/Alertness: Awake/alert Behavior During Therapy: Flat affect Overall Cognitive Status: Impaired/Different from baseline  Area of Impairment: Attention;Memory;Following commands;Awareness;Problem solving;Orientation                 Orientation Level: Disoriented to;Place ("we're not in the  hospital" when given choices ) Current Attention Level: Sustained Memory: Decreased short-term memory Following Commands: Follows one step commands inconsistently;Follows one step commands with increased time   Awareness: Intellectual Problem Solving: Slow processing;Decreased initiation;Requires verbal cues;Requires tactile cues General Comments: pt's cognition almost appeared to regress as session went on - answering home setup questions accurately per grandaughter confirmation - but once mobilizing with poor command follow and awareness       General Comments      Exercises     Assessment/Plan    PT Assessment Patient needs continued PT services  PT Problem List Decreased strength;Decreased activity tolerance;Decreased balance;Decreased mobility;Decreased coordination;Decreased safety awareness       PT Treatment Interventions Gait training;Functional mobility training;Therapeutic activities;Balance training;Neuromuscular re-education;Patient/family education;DME instruction;Stair training    PT Goals (Current goals can be found in the Care Plan section)  Acute Rehab PT Goals Patient Stated Goal: agreeable to working with therapy PT Goal Formulation: Patient unable to participate in goal setting Time For Goal Achievement: 2020-05-01 Potential to Achieve Goals: Good    Frequency Min 3X/week   Barriers to discharge        Co-evaluation PT/OT/SLP Co-Evaluation/Treatment: Yes Reason for Co-Treatment: Necessary to address cognition/behavior during functional activity;For patient/therapist safety   OT goals addressed during session: ADL's and self-care       AM-PAC PT "6 Clicks" Mobility  Outcome Measure Help needed turning from your back to your side while in a flat bed without using bedrails?: A Little Help needed moving from lying on your back to sitting on the side of a flat bed without using bedrails?: A Little Help needed moving to and from a bed to a chair  (including a wheelchair)?: A Lot Help needed standing up from a chair using your arms (e.g., wheelchair or bedside chair)?: A Lot Help needed to walk in hospital room?: Total Help needed climbing 3-5 steps with a railing? : Total 6 Click Score: 12    End of Session   Activity Tolerance: Patient tolerated treatment well;Patient limited by fatigue Patient left: in chair;with call bell/phone within reach;with chair alarm set;with nursing/sitter in room Nurse Communication: Mobility status PT Visit Diagnosis: Unsteadiness on feet (R26.81);Other abnormalities of gait and mobility (R26.89);Other symptoms and signs involving the nervous system (R29.898)    Time: 4481-8563 PT Time Calculation (min) (ACUTE ONLY): 29 min   Charges:   PT Evaluation $PT Eval Moderate Complexity: 1 Mod          03/29/2020  Ginger Carne., PT Acute Rehabilitation Services [phone]  (pager) [phone]  (office)  Tessie Fass Rasool Rommel 03/29/2020, 7:09 PM

## 2020-03-29 NOTE — Progress Notes (Signed)
OT Cancellation Note  Patient Details Name: Ryan Adams MRN: [id number] DOB: 1927-12-24   Cancelled Treatment:    Reason Eval/Treat Not Completed: Other (comment); pt currently eating and RN reports pt is transferring units in next few minutes. Will follow up as able.  Lou Cal, OT Acute Rehabilitation Services Pager [phone] Office [phone]   Raymondo Band 03/29/2020, 2:55 PM

## 2020-03-29 NOTE — Progress Notes (Addendum)
Report given to 3W RN T[place]dra. Appempted to call the number for a son listed as contact in chart to inform of patient transfer--no answer.

## 2020-03-30 ENCOUNTER — Inpatient Hospital Stay (HOSPITAL_COMMUNITY): Payer: Medicare Other

## 2020-03-30 DIAGNOSIS — I10 Essential (primary) hypertension: Secondary | ICD-10-CM | POA: Diagnosis not present

## 2020-03-30 DIAGNOSIS — I611 Nontraumatic intracerebral hemorrhage in hemisphere, cortical: Secondary | ICD-10-CM | POA: Diagnosis not present

## 2020-03-30 LAB — BASIC METABOLIC PANEL
Anion gap: 11 (ref 5–15)
BUN: 19 mg/dL (ref 8–23)
CO2: 20 mmol/L — ABNORMAL LOW (ref 22–32)
Calcium: 9 mg/dL (ref 8.9–10.3)
Chloride: 111 mmol/L (ref 98–111)
Creatinine, Ser: 1.29 mg/dL — ABNORMAL HIGH (ref 0.61–1.24)
GFR, Estimated: 53 mL/min — ABNORMAL LOW (ref >60)
Glucose, Bld: 84 mg/dL (ref 70–99)
Potassium: 4 mmol/L (ref 3.5–5.1)
Sodium: 142 mmol/L (ref 135–145)

## 2020-03-30 LAB — CBC
HCT: 32.7 % — ABNORMAL LOW (ref 39.0–52.0)
Hemoglobin: 10.7 g/dL — ABNORMAL LOW (ref 13.0–17.0)
MCH: 25 pg — ABNORMAL LOW (ref 26.0–34.0)
MCHC: 32.7 g/dL (ref 30.0–36.0)
MCV: 76.4 fL — ABNORMAL LOW (ref 80.0–100.0)
Platelets: 183 10*3/uL (ref 150–400)
RBC: 4.28 MIL/uL (ref 4.22–5.81)
RDW: 15.3 % (ref 11.5–15.5)
WBC: 5.7 10*3/uL (ref 4.0–10.5)
nRBC: 0 % (ref 0.0–0.2)

## 2020-03-30 LAB — GLUCOSE, CAPILLARY
Glucose-Capillary: 106 mg/dL — ABNORMAL HIGH (ref 70–99)
Glucose-Capillary: 115 mg/dL — ABNORMAL HIGH (ref 70–99)
Glucose-Capillary: 106 mg/dL — ABNORMAL HIGH (ref 70–99)
Glucose-Capillary: 89 mg/dL (ref 70–99)

## 2020-03-30 NOTE — Progress Notes (Addendum)
STROKE TEAM PROGRESS NOTE   SUBJECTIVE (INTERVAL HISTORY) Patient in bed with mitten restraints on. He is not cooperative with the examiner and is not following commands. Will repeat CTH to evaluate stability of bleed today.    OBJECTIVE Temp:  [97 F (36.1 C)-99 F (37.2 C)] 99 F (37.2 C) (11/18 0805) Pulse Rate:  [65-102] 77 (11/18 0805) Cardiac Rhythm: Normal sinus rhythm (11/17 2010) Resp:  [14-25] 20 (11/18 0805) BP: (117-175)/(65-106) 153/69 (11/18 0805) SpO2:  [98 %-100 %] 98 % (11/18 0805)  Recent Labs  Lab 03/27/20 1911 03/29/20 1759 03/29/20 2109 03/29/20 2333 03/30/20 0721  GLUCAP 89 148* 87 73 106*   Recent Labs  Lab 03/27/20 2105 03/29/20 0047 03/30/20 0337  NA 132* 136 142  K 3.9 3.3* 4.0  CL 97* 103 111  CO2 25 22 20*  GLUCOSE 104* 93 84  BUN 22 17 19   CREATININE 1.29* 0.94 1.29*  CALCIUM 8.9 8.7* 9.0   Recent Labs  Lab 03/27/20 2105  AST 31  ALT 16  ALKPHOS 46  BILITOT 0.8  PROT 6.3*  ALBUMIN 3.9   Recent Labs  Lab 03/27/20 2105 03/29/20 0047 03/30/20 0337  WBC 6.7 6.6 5.7  HGB 10.3* 10.2* 10.7*  HCT 32.5* 32.1* 32.7*  MCV 78.1* 77.2* 76.4*  PLT 190 195 183   No results for input(s): CKTOTAL, CKMB, CKMBINDEX, TROPONINI in the last 168 hours. Recent Labs    03/27/20 2105  LABPROT 13.9  INR 1.1   Recent Labs    03/27/20 1906  COLORURINE YELLOW  LABSPEC 1.012  PHURINE 7.0  GLUCOSEU NEGATIVE  HGBUR NEGATIVE  BILIRUBINUR NEGATIVE  KETONESUR 5*  PROTEINUR 30*  NITRITE NEGATIVE  LEUKOCYTESUR NEGATIVE       Component Value Date/Time   CHOL 250 (H) 03/28/2020 0558   TRIG 108 03/28/2020 0558   HDL 57 03/28/2020 0558   CHOLHDL 4.4 03/28/2020 0558   VLDL 22 03/28/2020 0558   LDLCALC 171 (H) 03/28/2020 0558   Lab Results  Component Value Date   HGBA1C 6.3 (H) 03/28/2020      Component Value Date/Time   LABOPIA NONE DETECTED 03/27/2020 2005   COCAINSCRNUR NONE DETECTED 03/27/2020 2005   LABBENZ NONE DETECTED  03/27/2020 2005   AMPHETMU NONE DETECTED 03/27/2020 2005   THCU NONE DETECTED 03/27/2020 2005   LABBARB NONE DETECTED 03/27/2020 2005    Recent Labs  Lab 03/27/20 2105  ETH <10    I have personally reviewed the radiological images below and agree with the radiology interpretations.  CT ANGIO HEAD W OR WO CONTRAST  Result Date: 03/28/2020 CLINICAL DATA:  Follow-up parenchymal hemorrhage EXAM: CT ANGIOGRAPHY HEAD AND NECK TECHNIQUE: Multidetector CT imaging of the head and neck was performed using the standard protocol during bolus administration of intravenous contrast. Multiplanar CT image reconstructions and MIPs were obtained to evaluate the vascular anatomy. Carotid stenosis measurements (when applicable) are obtained utilizing NASCET criteria, using the distal internal carotid diameter as the denominator. CONTRAST:  4mL OMNIPAQUE IOHEXOL 350 MG/ML SOLN COMPARISON:  Head CT from yesterday FINDINGS: CT HEAD FINDINGS Brain: Parenchymal hemorrhage in the posterior left temporal lobe, extending to the occipital lobe, with rim of edema. The shape is unchanged and dimensions are also essentially stable at up to 35 x 18 mm on axial slices. Cerebral volume loss in keeping with age. Vascular: See below Skull: Normal. Negative for fracture or focal lesion. Sinuses: Imaged portions are clear. Orbits: Cataract resection and scleral band on the right.  Review of the MIP images confirms the above findings CTA NECK FINDINGS Aortic arch: No acute finding where covered. Three vessel branching. Right carotid system: Mild atheromatous plaque about the bifurcation, especially for age. No stenosis or ulceration. Left carotid system: Mild atheromatous plaque about the bifurcation, especially for age. No stenosis or ulceration. Vertebral arteries: No proximal subclavian or brachiocephalic stenosis. Codominant vertebral arteries which are tortuous but smoothly contoured. No vertebral stenosis. Skeleton: Diffuse  degenerative disc disease with disc collapse and endplate erosion. Heterogeneous bony density without discrete lesion, likely osteopenic. A similar appearance was seen on the 2020 abdominal and lower extremity CT. Other neck: No acute finding. Upper chest: No acute finding Review of the MIP images confirms the above findings CTA HEAD FINDINGS Anterior circulation: Atheromatous calcification of the carotid siphons which is mild. No branch occlusion, beading, or aneurysm. No abnormal enhancement at the level of the hematoma. Posterior circulation: The vertebral and basilar arteries are smooth and widely patent. Incidental proximal basilar fenestration. No branch occlusion, beading, or aneurysm. No abnormal enhancement at the level of the hematoma. Venous sinuses: Diffusely patent Anatomic variants: As above Review of the MIP images confirms the above findings IMPRESSION: 1. No evidence of vascular lesion or mass underlying the patient's parenchymal hemorrhage. The hematoma is nonprogressive from yesterday. 2. Mild for age atherosclerosis. Electronically Signed   By: Monte Fantasia M.D.   On: 03/28/2020 11:19   CT HEAD WO CONTRAST  Result Date: 03/27/2020 CLINICAL DATA:  86 year old male with altered mental status. EXAM: CT HEAD WITHOUT CONTRAST TECHNIQUE: Contiguous axial images were obtained from the base of the skull through the vertex without intravenous contrast. COMPARISON:  None. FINDINGS: Brain: Left posterior temporal lobe intraparenchymal hemorrhage measures approximately 3.4 x 1.8 cm in greatest axial dimensions and 2.3 cm in craniocaudal length. There is mild surrounding edema. There is mild age-related atrophy and chronic microvascular ischemic changes. There is no midline shift. No extra-axial fluid collection. Vascular: No hyperdense vessel or unexpected calcification. Skull: Normal. Negative for fracture or focal lesion. Sinuses/Orbits: No acute finding. Other: None IMPRESSION: 1. 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 Further evaluation with MRI recommended. No midline shift. 2. Mild chronic microvascular ischemic changes. These results were called by telephone at the time of interpretation on 03/27/2020 at 7:34 pm to provider Carmin Muskrat , who verbally acknowledged these results. Electronically Signed   By: Anner Crete M.D.   On: 03/27/2020 19:37   CT ANGIO NECK W OR WO CONTRAST  Result Date: 03/28/2020 CLINICAL DATA:  Follow-up parenchymal hemorrhage EXAM: CT ANGIOGRAPHY HEAD AND NECK TECHNIQUE: Multidetector CT imaging of the head and neck was performed using the standard protocol during bolus administration of intravenous contrast. Multiplanar CT image reconstructions and MIPs were obtained to evaluate the vascular anatomy. Carotid stenosis measurements (when applicable) are obtained utilizing NASCET criteria, using the distal internal carotid diameter as the denominator. CONTRAST:  27mL OMNIPAQUE IOHEXOL 350 MG/ML SOLN COMPARISON:  Head CT from yesterday FINDINGS: CT HEAD FINDINGS Brain: Parenchymal hemorrhage in the posterior left temporal lobe, extending to the occipital lobe, with rim of edema. The shape is unchanged and dimensions are also essentially stable at up to 35 x 18 mm on axial slices. Cerebral volume loss in keeping with age. Vascular: See below Skull: Normal. Negative for fracture or focal lesion. Sinuses: Imaged portions are clear. Orbits: Cataract resection and scleral band on the right. Review of the MIP images confirms the above findings CTA  NECK FINDINGS Aortic arch: No acute finding where covered. Three vessel branching. Right carotid system: Mild atheromatous plaque about the bifurcation, especially for age. No stenosis or ulceration. Left carotid system: Mild atheromatous plaque about the bifurcation, especially for age. No stenosis or ulceration. Vertebral arteries: No proximal subclavian or  brachiocephalic stenosis. Codominant vertebral arteries which are tortuous but smoothly contoured. No vertebral stenosis. Skeleton: Diffuse degenerative disc disease with disc collapse and endplate erosion. Heterogeneous bony density without discrete lesion, likely osteopenic. A similar appearance was seen on the 2020 abdominal and lower extremity CT. Other neck: No acute finding. Upper chest: No acute finding Review of the MIP images confirms the above findings CTA HEAD FINDINGS Anterior circulation: Atheromatous calcification of the carotid siphons which is mild. No branch occlusion, beading, or aneurysm. No abnormal enhancement at the level of the hematoma. Posterior circulation: The vertebral and basilar arteries are smooth and widely patent. Incidental proximal basilar fenestration. No branch occlusion, beading, or aneurysm. No abnormal enhancement at the level of the hematoma. Venous sinuses: Diffusely patent Anatomic variants: As above Review of the MIP images confirms the above findings IMPRESSION: 1. No evidence of vascular lesion or mass underlying the patient's parenchymal hemorrhage. The hematoma is nonprogressive from yesterday. 2. Mild for age atherosclerosis. Electronically Signed   By: Monte Fantasia M.D.   On: 03/28/2020 11:19   MR BRAIN W WO CONTRAST  Result Date: 03/28/2020 CLINICAL DATA:  Cerebral parenchymal hemorrhage follow-up. EXAM: MRI HEAD WITHOUT AND WITH CONTRAST TECHNIQUE: Multiplanar, multiecho pulse sequences of the brain and surrounding structures were obtained without and with intravenous contrast. CONTRAST:  52mL GADAVIST GADOBUTROL 1 MMOL/ML IV SOLN COMPARISON:  CT head 03/27/2020 FINDINGS: Brain: Acute hematoma left temporoparietal lobe unchanged in size measuring approximately 19 x 40 mm. Moderate surrounding edema. Slight peripheral enhancement postcontrast infusion. No evidence of underlying mass lesion. Several additional areas of chronic microhemorrhage in both cerebral  hemispheres. Approximately 5 additional areas of chronic microhemorrhage. Mild atrophy. Negative for acute ischemic infarct. Mild white matter changes. No mass lesion identified. Vascular: Normal arterial flow voids. Skull and upper cervical spine: No focal skeletal lesion. Sinuses/Orbits: Paranasal sinuses clear. Ocular surgery on the right. No orbital mass. Other: None IMPRESSION: Acute hematoma left temporoparietal lobe unchanged with surrounding edema. 5 additional foci of chronic microhemorrhage in the brain. 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 No acute ischemic infarct. Mild chronic microvascular ischemic changes in the white matter. Electronically Signed   By: Franchot Gallo M.D.   On: 03/28/2020 13:25   DG Chest Port 1 View  Result Date: 03/27/2020 CLINICAL DATA:  Change in mental status EXAM: PORTABLE CHEST 1 VIEW COMPARISON:  November 10, 2008 FINDINGS: The heart size and mediastinal contours are within normal limits. Aortic knob calcifications are seen. Both lungs are clear. The visualized skeletal structures are unremarkable. IMPRESSION: No active disease. Electronically Signed   By: Prudencio Pair M.D.   On: 03/27/2020 19:25   ECHOCARDIOGRAM COMPLETE  Result Date: 03/28/2020    ECHOCARDIOGRAM REPORT   Patient Name:   Ryan Adams Date of Exam: 03/28/2020 Medical Rec #:  [id number]      Height:       72.0 in Accession #:    [id number]     Weight:       126.5 lb Date of Birth:  Oct 29, 1927       BSA:          1.754 m Patient Age:    42 years  BP:           119/87 mmHg Patient Gender: M              HR:           84 bpm. Exam Location:  Inpatient Procedure: 2D Echo, Cardiac Doppler and Color Doppler Indications:    Stroke 434.91 / I163.9  History:        Patient has no prior history of Echocardiogram examinations.  Sonographer:    Vickie Epley RDCS Referring Phys: [id number] ASHISH ARORA  Sonographer Comments: Technically difficult study due to poor echo windows. Image acquisition  challenging due to patient body habitus. IMPRESSIONS  1. Left ventricular ejection fraction, by estimation, is 60 to 65%. The left ventricle has normal function. Left ventricular endocardial border not optimally defined to evaluate regional wall motion. There is moderate asymmetric left ventricular hypertrophy of the basal-septal segment. Left ventricular diastolic parameters are indeterminate.  2. Right ventricular systolic function is normal. The right ventricular size is normal. There is normal pulmonary artery systolic pressure. The estimated right ventricular systolic pressure is 73.4 mmHg.  3. The mitral valve is normal in structure. Trivial mitral valve regurgitation. No evidence of mitral stenosis. Moderate mitral annular calcification.  4. The aortic valve is tricuspid. Aortic valve regurgitation is trivial. No aortic stenosis is present by visual assessment, Doppler alignment is suboptimal.  5. Aortic dilatation noted. There is mild dilatation of the aortic root, measuring 42 mm.  6. The inferior vena cava is normal in size with greater than 50% respiratory variability, suggesting right atrial pressure of 3 mmHg. Conclusion(s)/Recommendation(s): No intracardiac source of embolism detected on this transthoracic study. A transesophageal echocardiogram is recommended to exclude cardiac source of embolism if clinically indicated. FINDINGS  Left Ventricle: Left ventricular ejection fraction, by estimation, is 60 to 65%. The left ventricle has normal function. Left ventricular endocardial border not optimally defined to evaluate regional wall motion. The left ventricular internal cavity size was normal in size. There is moderate asymmetric left ventricular hypertrophy of the basal-septal segment. Left ventricular diastolic parameters are indeterminate. Right Ventricle: The right ventricular size is normal. No increase in right ventricular wall thickness. Right ventricular systolic function is normal. There is  normal pulmonary artery systolic pressure. The tricuspid regurgitant velocity is 2.58 m/s, and  with an assumed right atrial pressure of 3 mmHg, the estimated right ventricular systolic pressure is 19.3 mmHg. Left Atrium: Left atrial size was not well visualized. Right Atrium: Right atrial size was not well visualized. Pericardium: Trivial pericardial effusion is present. Mitral Valve: The mitral valve is normal in structure. Moderate mitral annular calcification. Trivial mitral valve regurgitation. No evidence of mitral valve stenosis. Tricuspid Valve: The tricuspid valve is normal in structure. Tricuspid valve regurgitation is mild . No evidence of tricuspid stenosis. Aortic Valve: The aortic valve is tricuspid. Aortic valve regurgitation is trivial. No aortic stenosis is present. Pulmonic Valve: The pulmonic valve was not well visualized. Pulmonic valve regurgitation is not visualized. No evidence of pulmonic stenosis. Aorta: Aortic dilatation noted and the ascending aorta was not well visualized. There is mild dilatation of the aortic root, measuring 42 mm. Venous: The inferior vena cava is normal in size with greater than 50% respiratory variability, suggesting right atrial pressure of 3 mmHg. IAS/Shunts: The interatrial septum was not well visualized.  LEFT VENTRICLE PLAX 2D LVIDd:         4.80 cm  Diastology LVIDs:         3.50  cm  LV e' medial:    4.29 cm/s LV PW:         0.80 cm  LV E/e' medial:  14.8 LV IVS:        0.80 cm  LV e' lateral:   5.87 cm/s LVOT diam:     2.10 cm  LV E/e' lateral: 10.8 LV SV:         42 LV SV Index:   24 LVOT Area:     3.46 cm  RIGHT VENTRICLE RV S prime:     23.30 cm/s TAPSE (M-mode): 2.4 cm LEFT ATRIUM           Index       RIGHT ATRIUM           Index LA diam:      4.40 cm 2.51 cm/m  RA Area:     14.40 cm LA Vol (A4C): 33.1 ml 18.87 ml/m RA Volume:   35.80 ml  20.41 ml/m  AORTIC VALVE LVOT Vmax:   54.40 cm/s LVOT Vmean:  38.800 cm/s LVOT VTI:    0.121 m  AORTA Ao Root  diam: 4.20 cm MITRAL VALVE               TRICUSPID VALVE MV Area (PHT): 3.37 cm    TR Peak grad:   26.6 mmHg MV Decel Time: 225 msec    TR Vmax:        258.00 cm/s MV E velocity: 63.30 cm/s MV A velocity: 77.10 cm/s  SHUNTS MV E/A ratio:  0.82        Systemic VTI:  0.12 m                            Systemic Diam: 2.10 cm Cherlynn Kaiser MD Electronically signed by Cherlynn Kaiser MD Signature Date/Time: 03/28/2020/9:50:42 AM    Final     PHYSICAL EXAM  Temp:  [97 F (36.1 C)-99 F (37.2 C)] 99 F (37.2 C) (11/18 0805) Pulse Rate:  [65-102] 77 (11/18 0805) Resp:  [14-25] 20 (11/18 0805) BP: (117-175)/(65-106) 153/69 (11/18 0805) SpO2:  [98 %-100 %] 98 % (11/18 0805)  General - Well nourished, well developed, in no apparent distress.  Ophthalmologic - fundi not visualized due to noncooperation.  Cardiovascular - Regular rhythm and rate.  Neuro - Awake alert, eyes open, able to answer his name, not oriented to place states he is at [facility], is not oriented to year or month or sitaution. Does not follow command to close eyes and open them or stick out his tongue. Difficult to check visual field, but blinking to visual threat bilaterally, able to track both sides. Able to gaze bilaterally. No facial droop. PERRL. Moving BUEs, symmetric, no drift. Unable to assess drift in BLE he resists when the examiner attempts to lift his legs and maintains his legs crossed in the bed. UTA coordination   ASSESSMENT/PLAN Ryan Adams is a 86 y.o. male with history of diabetes and hypertension admitted for confusion, aphasia. No tPA given due to [place].    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  Chronic microhemorrhages in the brain suggesting CAA versus hypertension.  No acute infarct.  CT head and neck unremarkable  Repeat CTH ordered 03/30/20 to evaluate for bleed stability   2D Echo EF 60 to 65%  LDL  171  HgbA1c 6.3  Heparin subcu for VTE prophylaxis  No antithrombotic prior to admission, now on No antithrombotic due to [place]  Ongoing aggressive stroke risk factor management  Therapy recommendations: SNF 24/7 supervision   Disposition: Pending  Diabetes  HgbA1c 6.3 goal < 7.0  Controlled  CBG monitoring  SSI  DM education and close PCP follow up  Hypertension . Home meds - amlodipine, lotensin and metoprolol . Stable . On low-dose Cleviprex, taper off as able . Home lotensin and metoprolol have been resumed this admission   Long term BP goal normotensive  Hyperlipidemia  Home meds: None  LDL 171, goal < 70  Consider statin at discharge give he is here with a bleed and statins can increase bleeding risk   Agitation  Likely due to [place] in the setting of underlying cognitive impairment  Haldol and Ativanas needed  Seroquel 25mg  BID   Other Stroke Risk Factors  Advanced age   Hospital day # [place], NP  [facility] Nurse Practitioner  03/30/2020 10:13 AM  ATTENDING NOTE: I reviewed above note and agree with the assessment and plan. Pt was seen and examined.   No family at bedside.  Patient lying in bed, drowsy sleepy, but arousable.  Moderate dysarthria, however able to tell me his age and name, however not able to tell me the time.  Not following commands well, partially could be due to severe hard of hearing.  Moving bilateral upper extremities symmetrically, seem to have equal strength bilateral lower extremity however difficult on exam.  Examination not significant changed from yesterday, however given his difficult exam, will repeat CT to make sure hematoma stabilization.  Otherwise continue supportive treatment per primary team.  Will follow.  Rosalin Hawking, MD PhD Stroke Neurology 03/30/2020 5:08 PM    To contact Stroke Continuity provider, please refer to [email or web address] After hours, contact General Neurology

## 2020-03-30 NOTE — Progress Notes (Signed)
°  Speech Language Pathology Treatment: Cognitive-Linquistic  Patient Details Name: Ryan Adams MRN: [id number] DOB: 03-28-1928 Today's Date: 03/30/2020 Time: 4163-8453 SLP Time Calculation (min) (ACUTE ONLY): 24 min  Assessment / Plan / Recommendation Clinical Impression  Pt was seen for diagnostic treatment session. He was alert and cooperative but disoriented to time, place, and situation. The WAB-Bedside was administered and the pt received a score of 22/60, demonstrating deficits in both language expression and comprehension. He demonstrated much more spontaneous speech in today's session compared to the initial evaluation. His speech was observed to be fluent and circumlocutory with frequent semantic paraphasias and perseverations. He also demonstrated significant word-finding and naming difficulties throughout the session. Repetition was [facility] at the word and phrase level, but accuracy decreased at the sentence level. His auditory comprehension continues to be impaired as he demonstrated inconsistent accuracy while answering basic yes/no questions. He was also unable to follow basic 1-step directions. SLP will continue to f/u acutely to address aphasia.   HPI HPI: Pt is 86 year old man with past history of diabetes, hypertension, with sudden onset of confusion.  On examination revealed dysarthria, expressive aphasia with no focal motor or sensory deficits. 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       SLP Plan  Continue with current plan of care       Recommendations                   Oral Care Recommendations: Oral care BID Follow up Recommendations: Skilled Nursing facility;24 hour supervision/assistance SLP Visit Diagnosis: Aphasia (R47.01) Plan: Continue with current plan of care       GO                Greggory Keen 03/30/2020, 1:41 PM

## 2020-03-30 NOTE — NC FL2 (Signed)
[place] LEVEL OF CARE SCREENING TOOL     IDENTIFICATION  Patient Name: Ryan Adams Birthdate: 09/05/1927 Sex: male Admission Date (Current Location): 03/27/2020  [facility] and [place] Number:  [employer] and Address:  [facility] [place] [address] [place], [place] [address]      Provider Number: [id number]  Attending Physician Name and Address:  Terrilee Croak, MD  Relative Name and Phone Number:       Current Level of Care: Hospital Recommended Level of Care: [place] Prior Approval Number:    Date Approved/Denied:   PASRR Number: [id number]  Discharge Plan: SNF    Current Diagnoses: Patient Active Problem List   Diagnosis Date Noted  . ICH (intracerebral hemorrhage) ([place]) 03/27/2020    Orientation RESPIRATION BLADDER Height & Weight     Self  Normal Incontinent Weight: 112 lb 3.4 oz (50.9 kg) Height:  6' (182.9 cm)  BEHAVIORAL SYMPTOMS/MOOD NEUROLOGICAL BOWEL NUTRITION STATUS      Incontinent Diet (regular)  AMBULATORY STATUS COMMUNICATION OF NEEDS Skin   Extensive Assist Verbally Normal                       Personal Care Assistance Level of Assistance  Bathing, Feeding, Dressing Bathing Assistance: Maximum assistance Feeding assistance: Limited assistance Dressing Assistance: Maximum assistance     Functional Limitations Info  Hearing, Speech   Hearing Info: Impaired Speech Info: Impaired (delayed responses, dysarthria)    SPECIAL CARE FACTORS FREQUENCY  PT (By licensed PT), OT (By licensed OT), Speech therapy     PT Frequency: 5x/wk OT Frequency: 5x/wk     Speech Therapy Frequency: 5x/wk      Contractures Contractures Info: Not present    Additional Factors Info  Code Status, Allergies, Psychotropic Code Status Info: Full Allergies Info: None on file Psychotropic Info: Seroquel 25mg  2x/day         Current Medications (03/30/2020):  This is the current hospital  active medication list Current Facility-Administered Medications  Medication Dose Route Frequency Provider Last Rate Last Admin  . 0.9 %  sodium chloride infusion   Intravenous Continuous Dahal, Binaya, MD 75 mL/hr at 03/30/20 1103 Rate Change at 03/30/20 1103  . acetaminophen (TYLENOL) tablet 650 mg  650 mg Oral Q4H PRN Amie Portland, MD       Or  . acetaminophen (TYLENOL) 160 MG/5ML solution 650 mg  650 mg Per Tube Q4H PRN Amie Portland, MD       Or  . acetaminophen (TYLENOL) suppository 650 mg  650 mg Rectal Q4H PRN Amie Portland, MD      . Chlorhexidine Gluconate Cloth 2 % PADS 6 each  6 each Topical Daily Amie Portland, MD   6 each at 03/29/20 0900  . ferrous sulfate tablet 325 mg  325 mg Oral BID WC Rosalin Hawking, MD   325 mg at 03/30/20 1120  . haloperidol lactate (HALDOL) injection 5 mg  5 mg Intravenous Q6H PRN Rosalin Hawking, MD   5 mg at 03/29/20 2005  . heparin injection 5,000 Units  5,000 Units Subcutaneous Q12H Rosalin Hawking, MD   5,000 Units at 03/30/20 1121  . insulin aspart (novoLOG) injection 0-5 Units  0-5 Units Subcutaneous QHS Rosalin Hawking, MD      . insulin aspart (novoLOG) injection 0-9 Units  0-9 Units Subcutaneous TID WC Rosalin Hawking, MD   1 Units at 03/29/20 1844  . labetalol (NORMODYNE) injection 5-20 mg  5-20 mg Intravenous Q2H PRN Rosalin Hawking, MD   10 mg at 03/30/20 5830  . LORazepam (ATIVAN) injection 2 mg  2 mg Intravenous Q6H PRN Rosalin Hawking, MD   2 mg at 03/29/20 2121  . metoprolol tartrate (LOPRESSOR) tablet 25 mg  25 mg Oral BID Rosalin Hawking, MD   25 mg at 03/30/20 1121  . pantoprazole sodium (PROTONIX) 40 mg/20 mL oral suspension 40 mg  40 mg Oral Daily Rosalin Hawking, MD   40 mg at 03/30/20 1121  . QUEtiapine (SEROQUEL) tablet 25 mg  25 mg Oral BID Rosalin Hawking, MD   25 mg at 03/30/20 1120  . senna-docusate (Senokot-S) tablet 1 tablet  1 tablet Oral BID Amie Portland, MD   1 tablet at 03/30/20 1120     Discharge Medications: Please see discharge summary for a list of  discharge medications.  Relevant Imaging Results:  Relevant Lab Results:   Additional Information SS#: [id number]  Geralynn Ochs, LCSW

## 2020-03-30 NOTE — Progress Notes (Signed)
PROGRESS NOTE  BENJERMAN Adams  DOB: 01-Sep-1927  PCP: Neale Burly, MD KGM:[id number]  DOA: 03/27/2020  LOS: 3 days   Chief Complaint  Patient presents with  . Altered Mental Status    Brief narrative: Ryan Adams is a 86 y.o. male with past medical history significant for diabetes mellitus and hypertension. Patient was brought to ED at [facility] by family on 03/27/2020 for evaluation of confusion. Last known normal 2 days ago.  For the preceding 2 days, patient was not able to answer any questions to the family.  Family also noted some bruising in his left temple but he did not have a witnessed fall.  Up until last Saturday-03/25/2020-living independently, still driving. Brain imaging showed a left posterior temporal ICH.   Patient was transferred to [facility] and admitted under neurology service. Transferred to hospital service on 11/18.  Subjective: Patient was seen and examined this morning.  Elderly African-American male.  Awake, confused, making involuntary movements in bed.  Unable to follow commands. Chart reviewed No fever, hemodynamically stable, Blood work this morning with creatinine acutely up to 1.29  Assessment/Plan: Acute intracranial hemorrhage -CT scan of head with left temporal ICH, hypertensive vs CAA -MRI acute hematoma left temporoparietal lobe, unchanged.  Chronic microhemorrhages.  -CT angio head and neck unremarkable. -Echo with EF 60 to 65%, -LDL elevated to 171, A1c 6.3. -Neurology advised against antithrombotic because of ICH. -PT eval obtained.  Recommend SNF.  Delirium/acute encephalopathy -Related to [place].  Prior to admission, patient was functional but had underlying risk factors for dementia. -In the hospital, he has had episodes of agitation and confusion.  Currently on Seroquel 25 mg twice daily and PRN Haldol and Ativan.  AKI -Blood work this morning with creatinine up to 1.29 acutely. -Probably related to poor oral hydration. -Start normal  saline at 75 mils per hour.  Keep benazepril on hold.  Diabetes mellitus type 2 -A1c 6.3. -Continue sliding-scale insulin with Accu-Cheks.  Essential hypertension -Currently on metoprolol.  Keep benazepril on hold.  Hyperlipidemia -LDL 171, goal less than 70.  Consider statin at discharge.  Mobility: PT eval obtained.  SNF recommended. Code Status:   Code Status: Full Code  Nutritional status: Body mass index is 15.22 kg/m.     Diet Order            Diet regular Room service appropriate? Yes with Assist; Fluid consistency: Thin  Diet effective now                 DVT prophylaxis: heparin injection 5,000 Units Start: 03/28/20 1500 SCD's Start: 03/28/20 0357   Antimicrobials:  None Fluid: None Consultants: Neurology Family Communication:  None at bedside  Status is: Inpatient  Remains inpatient appropriate because -pending SNF placement   Dispo: The patient is from: Home              Anticipated d/c is to: SNF              Anticipated d/c date is: Whenever bed is available              Patient currently is medically stable to d/c.       Infusions:  . sodium chloride 40 mL/hr at 03/30/20 0748    Scheduled Meds: . Chlorhexidine Gluconate Cloth  6 each Topical Daily  . ferrous sulfate  325 mg Oral BID WC  . heparin injection (subcutaneous)  5,000 Units Subcutaneous Q12H  . insulin aspart  0-5 Units Subcutaneous  QHS  . insulin aspart  0-9 Units Subcutaneous TID WC  . metoprolol tartrate  25 mg Oral BID  . pantoprazole sodium  40 mg Oral Daily  . QUEtiapine  25 mg Oral BID  . senna-docusate  1 tablet Oral BID    Antimicrobials: Anti-infectives (From admission, onward)   None      PRN meds: acetaminophen **OR** acetaminophen (TYLENOL) oral liquid 160 mg/5 mL **OR** acetaminophen, haloperidol lactate, labetalol, LORazepam   Objective: Vitals:   03/30/20 0300 03/30/20 0805  BP: 129/75 (!) 153/69  Pulse: 68 77  Resp: 18 20  Temp: 98.3 F (36.8  C) 99 F (37.2 C)  SpO2: 99% 98%    Intake/Output Summary (Last 24 hours) at 03/30/2020 1047 Last data filed at 03/30/2020 0900 Gross per 24 hour  Intake 982.8 ml  Output --  Net 982.8 ml   Filed Weights   03/27/20 1859 03/29/20 0900  Weight: 57.4 kg 50.9 kg   Weight change:  Body mass index is 15.22 kg/m.   Physical Exam: General exam: Not in physical pain but making involuntary movements Skin: No rashes, lesions or ulcers. HEENT: Atraumatic, normocephalic, supple neck, no obvious bleeding Lungs: Clear to auscultation bilaterally CVS: Regular rate and rhythm, no murmur GI/Abd soft, nontender, nondistended, bowel sound present CNS: Alert, awake, not oriented, unable to follow commands Psychiatry: Depressed look Extremities: No pedal edema, no calf tenderness  Data Review: I have personally reviewed the laboratory data and studies available.  Recent Labs  Lab 03/27/20 2105 03/29/20 0047 03/30/20 0337  WBC 6.7 6.6 5.7  HGB 10.3* 10.2* 10.7*  HCT 32.5* 32.1* 32.7*  MCV 78.1* 77.2* 76.4*  PLT 190 195 183   Recent Labs  Lab 03/27/20 2105 03/29/20 0047 03/30/20 0337  NA 132* 136 142  K 3.9 3.3* 4.0  CL 97* 103 111  CO2 25 22 20*  GLUCOSE 104* 93 84  BUN 22 17 19   CREATININE 1.29* 0.94 1.29*  CALCIUM 8.9 8.7* 9.0    F/u labs ordered  Signed, Terrilee Croak, MD [facility] 03/30/2020

## 2020-03-30 NOTE — Care Management Important Message (Signed)
Important Message  Patient Details  Name: Ryan Adams MRN: [id number] Date of Birth: 07-20-1927   Medicare Important Message Given:  Yes     Fausto Sampedro 03/30/2020, 3:30 PM

## 2020-03-30 NOTE — TOC Initial Note (Signed)
Transition of Care [facility]) - Initial/Assessment Note    Patient Details  Name: Ryan Adams MRN: [id number] Date of Birth: 1927/09/01  [facility] CM/SW Contact:    Geralynn Ochs, LCSW Phone Number: 03/30/2020, 2:47 PM  Clinical Narrative:    CSW spoke with patient's son, Ryan Adams, via phone to discuss recommendation for SNF. Family in agreement, preference for [facility]. CSW completed referral, sent to [facility] for review. Left voicemail for Admissions to review and call CSW back with offer determination. CSW to follow.               Expected Discharge Plan: Skilled Nursing Facility Barriers to Discharge: Insurance Authorization, Continued Medical Work up   Patient Goals and CMS Choice Patient states their goals for this hospitalization and ongoing recovery are:: patient unable to participate in goal setting due to disorientation CMS Medicare.gov Compare Post Acute Care list provided to:: Patient Represenative (must comment) Choice offered to / list presented to : Adult Children  Expected Discharge Plan and Services Expected Discharge Plan: [place] Choice: [place] Living arrangements for the past 2 months: Apartment                                      Prior Living Arrangements/Services Living arrangements for the past 2 months: Apartment Lives with:: Self Patient language and need for interpreter reviewed:: No Do you feel safe going back to the place where you live?: Yes      Need for Family Participation in Patient Care: Yes (Comment) Care giver support system in place?: No (comment)   Criminal Activity/Legal Involvement Pertinent to Current Situation/Hospitalization: No - Comment as needed  Activities of Daily Living      Permission Sought/Granted Permission sought to share information with : Facility Sport and exercise psychologist, Family Supports Permission granted to share information  with : Yes, Verbal Permission Granted  Share Information with NAME: Barron Schmid  Permission granted to share info w AGENCY: SNF  Permission granted to share info w Relationship: Children     Emotional Assessment   Attitude/Demeanor/Rapport: Unable to Assess Affect (typically observed): Unable to Assess Orientation: : Oriented to Self Alcohol / Substance Use: Not Applicable Psych Involvement: No (comment)  Admission diagnosis:  ICH (intracerebral hemorrhage) ([place]) [I61.9] Acute spontaneous intraparenchymal intracranial hemorrhage ([place]) [I62.9] Patient Active Problem List   Diagnosis Date Noted  . ICH (intracerebral hemorrhage) ([place]) 03/27/2020   PCP:  Neale Burly, MD Pharmacy:  No Pharmacies Listed    Social Determinants of Health (SDOH) Interventions    Readmission Risk Interventions No flowsheet data found.

## 2020-03-31 DIAGNOSIS — R410 Disorientation, unspecified: Secondary | ICD-10-CM | POA: Diagnosis not present

## 2020-03-31 DIAGNOSIS — I10 Essential (primary) hypertension: Secondary | ICD-10-CM | POA: Diagnosis not present

## 2020-03-31 DIAGNOSIS — I611 Nontraumatic intracerebral hemorrhage in hemisphere, cortical: Secondary | ICD-10-CM | POA: Diagnosis not present

## 2020-03-31 LAB — BASIC METABOLIC PANEL
Anion gap: 9 (ref 5–15)
BUN: 14 mg/dL (ref 8–23)
CO2: 24 mmol/L (ref 22–32)
Calcium: 8.9 mg/dL (ref 8.9–10.3)
Chloride: 110 mmol/L (ref 98–111)
Creatinine, Ser: 1.33 mg/dL — ABNORMAL HIGH (ref 0.61–1.24)
GFR, Estimated: 51 mL/min — ABNORMAL LOW (ref >60)
Glucose, Bld: 97 mg/dL (ref 70–99)
Potassium: 3.9 mmol/L (ref 3.5–5.1)
Sodium: 143 mmol/L (ref 135–145)

## 2020-03-31 LAB — CBC
HCT: 34.7 % — ABNORMAL LOW (ref 39.0–52.0)
Hemoglobin: 11.2 g/dL — ABNORMAL LOW (ref 13.0–17.0)
MCH: 24.9 pg — ABNORMAL LOW (ref 26.0–34.0)
MCHC: 32.3 g/dL (ref 30.0–36.0)
MCV: 77.3 fL — ABNORMAL LOW (ref 80.0–100.0)
Platelets: 228 10*3/uL (ref 150–400)
RBC: 4.49 MIL/uL (ref 4.22–5.81)
RDW: 15.4 % (ref 11.5–15.5)
WBC: 4.6 10*3/uL (ref 4.0–10.5)
nRBC: 0 % (ref 0.0–0.2)

## 2020-03-31 LAB — GLUCOSE, CAPILLARY
Glucose-Capillary: 96 mg/dL (ref 70–99)
Glucose-Capillary: 160 mg/dL — ABNORMAL HIGH (ref 70–99)
Glucose-Capillary: 93 mg/dL (ref 70–99)
Glucose-Capillary: 93 mg/dL (ref 70–99)

## 2020-03-31 NOTE — TOC Progression Note (Signed)
Transition of Care [facility]) - Progression Note    Patient Details  Name: OAKLEN THIAM MRN: [id number] Date of Birth: 07-23-1927  Transition of Care [facility]) CM/SW [place], [place] Phone Number: 03/31/2020, 1:21 PM  Clinical Narrative:   CSW checked in with PT about how patient is doing today, and able to follow commands much better than yesterday. CSW provided update to [facility], and they are able to offer a bed. Patient has a Actuary today and will need insurance authorization, so patient can admit to SNF on Monday, if stable. CSW contacted patient's son, Ebony Hail, to provide update. CSW to follow.    Expected Discharge Plan: Skilled Nursing Facility Barriers to Discharge: Ship broker, Continued Medical Work up, Facility will not accept until restraint criteria met  Expected Discharge Plan and Services Expected Discharge Plan: [place] Choice: [place] arrangements for the past 2 months: Apartment                                       Social Determinants of Health (SDOH) Interventions    Readmission Risk Interventions No flowsheet data found.

## 2020-03-31 NOTE — Evaluation (Signed)
Clinical/Bedside Swallow Evaluation Patient Details  Name: Ryan Adams MRN: [id number] Date of Birth: 02-10-1928  Today's Date: 03/31/2020 Time: SLP Start Time (ACUTE ONLY): 2035 SLP Stop Time (ACUTE ONLY): 0925 SLP Time Calculation (min) (ACUTE ONLY): 20 min  Past Medical History: No past medical history on file. HPI:  Pt is 86 year old man with past history of diabetes, hypertension, with sudden onset of confusion.  On examination revealed dysarthria, expressive aphasia with no focal motor or sensory deficits. 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 Initial swallow evaluation on 11/16 with recs for regular solids, thin liquids. Noted with subsequent coughing while eating and made NPO, new swallow orders 11/19.    Assessment / Plan / Recommendation Clinical Impression  Pt participated in repeat clinical swallow evaluation.  Oral care provided and dentures placed.  With assist and several trials, pt able to hold cup and sip from edge and use straw appropriately with no s/s of aspiration. He fed himself crackers and applesauce from a spoon with no focal motor nor obvious sensory deficits.  No s/s of a dysphagia.  He will, however, need help with self-feeding although he can do most of the work himself. Give pills whole in applesauce; resume a regular diet with thin liquids. D/W RN. No further f/u for swallowing. Will continue to see for aphasia.  SLP Visit Diagnosis: Dysphagia, unspecified (R13.10)    Aspiration Risk    minimal   Diet Recommendation   regular, thin liquids  Medication Administration: Whole meds with puree    Other  Recommendations Oral Care Recommendations: Oral care BID   Follow up Recommendations Skilled Nursing facility;24 hour supervision/assistance      Frequency and Duration            Prognosis        Swallow Study   General Date of Onset: 03/27/20 HPI: Pt is 86 year old man with past history of diabetes, hypertension,  with sudden onset of confusion.  On examination revealed dysarthria, expressive aphasia with no focal motor or sensory deficits. 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 Initial swallow evaluation on 11/16 with recs for regular solids, thin liquids. Noted with subsequent coughing while eating and made NPO, new swallow orders 11/19.  Type of Study: Bedside Swallow Evaluation Diet Prior to this Study: NPO Temperature Spikes Noted: No Respiratory Status: Room air History of Recent Intubation: No Behavior/Cognition: Requires cueing Oral Cavity Assessment: Within Functional Limits Oral Care Completed by SLP: Yes Oral Cavity - Dentition: Dentures, top;Dentures, bottom Self-Feeding Abilities: Needs assist Patient Positioning: Upright in bed Baseline Vocal Quality: Normal Volitional Cough: Cognitively unable to elicit Volitional Swallow: Able to elicit    Oral/Motor/Sensory Function Overall Oral Motor/Sensory Function: Within functional limits   Ice Chips Ice chips: Not tested   Thin Liquid Thin Liquid: Within functional limits Presentation: Cup;Straw    Nectar Thick Nectar Thick Liquid: Not tested   Honey Thick Honey Thick Liquid: Not tested   Puree Puree: Within functional limits   Solid     Solid: Within functional limits      Ryan Adams 03/31/2020,9:52 AM  Ryan Adams, Ryan Adams Office number [phone] Pager [phone]

## 2020-03-31 NOTE — Progress Notes (Signed)
  Speech Language Pathology Treatment: Cognitive-Linquistic  Patient Details Name: Ryan Adams MRN: [id number] DOB: 04-Dec-1927 Today's Date: 03/31/2020 Time: 6378-5885 SLP Time Calculation (min) (ACUTE ONLY): 18 min  Assessment / Plan / Recommendation Clinical Impression  Pt's communication appears to be improved since yesterday's treatment session.  He was able to follow simple one-step commands (addition of R/L component influenced accuracy).  He answered basic biographical questions about his work, where he grew up, and his family.  Output was fluent with improved relevance to topic and decreased incidents of perseveratory responses.  He required mod verbal cues for two step commands and to narrow focus for word-retrieval.  He was convivial and interactive with good pragmatics.  Continue aphasia treatment.  Pt's aphasia has evolved to a posterior, primary receptive aphasia and he is no longer classified as global. SLP will follow.   HPI HPI: Pt is 86 year old man with past history of diabetes, hypertension, with sudden onset of confusion.  On examination revealed dysarthria, expressive aphasia with no focal motor or sensory deficits. 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       SLP Plan  Continue with current plan of care       Recommendations  Medication Administration: Whole meds with puree                Oral Care Recommendations: Oral care BID Follow up Recommendations: Skilled Nursing facility;24 hour supervision/assistance SLP Visit Diagnosis: Aphasia (R47.01) Plan: Continue with current plan of care       GO                [name] 03/31/2020, 9:55 AM  Estill Bamberg L. Tivis Ringer, [place] Office number [phone] Pager [phone]

## 2020-03-31 NOTE — Progress Notes (Signed)
STROKE TEAM PROGRESS NOTE   SUBJECTIVE (INTERVAL HISTORY) Sitter at the bedside. Pt sleepy yesterday and diet and po meds were on hold last night. Per RN, pt had some agitation, confusion, trying to get out of bed overnight and this am, sitter placed. This morning he is more awake alert, passed swallow, seroquel resumed. Currently, he is sitting in chair, awake alert, know his age and name, not orientated to time, more interactive, less dysarthria, moving all extremities. CT repeat stable hematoma.    OBJECTIVE Temp:  [98.3 F (36.8 C)-99 F (37.2 C)] 98.3 F (36.8 C) (11/19 0333) Pulse Rate:  [72-102] 96 (11/19 0333) Cardiac Rhythm: Normal sinus rhythm (11/18 1900) Resp:  [18-20] 18 (11/19 0333) BP: (113-157)/(69-102) 154/102 (11/19 0333) SpO2:  [96 %-100 %] 96 % (11/19 0333)  Recent Labs  Lab 03/29/20 2333 03/30/20 0721 03/30/20 1200 03/30/20 1633 03/30/20 2121  GLUCAP 73 106* 115* 106* 89   Recent Labs  Lab 03/27/20 2105 03/27/20 2105 03/29/20 0047 03/30/20 0337 03/31/20 0318  NA 132*  --  136 142 143  K 3.9  --  3.3* 4.0 3.9  CL 97*  --  103 111 110  CO2 25  --  22 20* 24  GLUCOSE 104*  --  93 84 97  BUN 22  --  17 19 14   CREATININE 1.29*  --  0.94 1.29* 1.33*  CALCIUM 8.9   < > 8.7* 9.0 8.9   < > = values in this interval not displayed.   Recent Labs  Lab 03/27/20 2105  AST 31  ALT 16  ALKPHOS 46  BILITOT 0.8  PROT 6.3*  ALBUMIN 3.9   Recent Labs  Lab 03/27/20 2105 03/29/20 0047 03/30/20 0337 03/31/20 0318  WBC 6.7 6.6 5.7 4.6  HGB 10.3* 10.2* 10.7* 11.2*  HCT 32.5* 32.1* 32.7* 34.7*  MCV 78.1* 77.2* 76.4* 77.3*  PLT 190 195 183 228   No results for input(s): CKTOTAL, CKMB, CKMBINDEX, TROPONINI in the last 168 hours. No results for input(s): LABPROT, INR in the last 72 hours. No results for input(s): COLORURINE, LABSPEC, [place], GLUCOSEU, HGBUR, BILIRUBINUR, KETONESUR, PROTEINUR, UROBILINOGEN, NITRITE, LEUKOCYTESUR in the last 72  hours.  Invalid input(s): APPERANCEUR     Component Value Date/Time   CHOL 250 (H) 03/28/2020 0558   TRIG 108 03/28/2020 0558   HDL 57 03/28/2020 0558   CHOLHDL 4.4 03/28/2020 0558   VLDL 22 03/28/2020 0558   LDLCALC 171 (H) 03/28/2020 0558   Lab Results  Component Value Date   HGBA1C 6.3 (H) 03/28/2020      Component Value Date/Time   LABOPIA NONE DETECTED 03/27/2020 2005   COCAINSCRNUR NONE DETECTED 03/27/2020 2005   LABBENZ NONE DETECTED 03/27/2020 2005   AMPHETMU NONE DETECTED 03/27/2020 2005   THCU NONE DETECTED 03/27/2020 2005   LABBARB NONE DETECTED 03/27/2020 2005    Recent Labs  Lab 03/27/20 2105  ETH <10    I have personally reviewed the radiological images below and agree with the radiology interpretations.  CT ANGIO HEAD W OR WO CONTRAST  Result Date: 03/28/2020 CLINICAL DATA:  Follow-up parenchymal hemorrhage EXAM: CT ANGIOGRAPHY HEAD AND NECK TECHNIQUE: Multidetector CT imaging of the head and neck was performed using the standard protocol during bolus administration of intravenous contrast. Multiplanar CT image reconstructions and MIPs were obtained to evaluate the vascular anatomy. Carotid stenosis measurements (when applicable) are obtained utilizing NASCET criteria, using the distal internal carotid diameter as the denominator. CONTRAST:  89mL OMNIPAQUE IOHEXOL  350 MG/ML SOLN COMPARISON:  Head CT from yesterday FINDINGS: CT HEAD FINDINGS Brain: Parenchymal hemorrhage in the posterior left temporal lobe, extending to the occipital lobe, with rim of edema. The shape is unchanged and dimensions are also essentially stable at up to 35 x 18 mm on axial slices. Cerebral volume loss in keeping with age. Vascular: See below Skull: Normal. Negative for fracture or focal lesion. Sinuses: Imaged portions are clear. Orbits: Cataract resection and scleral band on the right. Review of the MIP images confirms the above findings CTA NECK FINDINGS Aortic arch: No acute finding  where covered. Three vessel branching. Right carotid system: Mild atheromatous plaque about the bifurcation, especially for age. No stenosis or ulceration. Left carotid system: Mild atheromatous plaque about the bifurcation, especially for age. No stenosis or ulceration. Vertebral arteries: No proximal subclavian or brachiocephalic stenosis. Codominant vertebral arteries which are tortuous but smoothly contoured. No vertebral stenosis. Skeleton: Diffuse degenerative disc disease with disc collapse and endplate erosion. Heterogeneous bony density without discrete lesion, likely osteopenic. A similar appearance was seen on the 2020 abdominal and lower extremity CT. Other neck: No acute finding. Upper chest: No acute finding Review of the MIP images confirms the above findings CTA HEAD FINDINGS Anterior circulation: Atheromatous calcification of the carotid siphons which is mild. No branch occlusion, beading, or aneurysm. No abnormal enhancement at the level of the hematoma. Posterior circulation: The vertebral and basilar arteries are smooth and widely patent. Incidental proximal basilar fenestration. No branch occlusion, beading, or aneurysm. No abnormal enhancement at the level of the hematoma. Venous sinuses: Diffusely patent Anatomic variants: As above Review of the MIP images confirms the above findings IMPRESSION: 1. No evidence of vascular lesion or mass underlying the patient's parenchymal hemorrhage. The hematoma is nonprogressive from yesterday. 2. Mild for age atherosclerosis. Electronically Signed   By: Monte Fantasia M.D.   On: 03/28/2020 11:19   CT HEAD WO CONTRAST  Result Date: 03/31/2020 CLINICAL DATA:  Follow-up examination for intracranial hemorrhage. EXAM: CT HEAD WITHOUT CONTRAST TECHNIQUE: Contiguous axial images were obtained from the base of the skull through the vertex without intravenous contrast. COMPARISON:  Prior study from 03/28/2020. FINDINGS: Brain: Previously identified left  posterior temporal intraparenchymal hemorrhage is not significantly changed in size measuring 3.5 x 2.3 x 2.8 cm (estimated volume 11 cc), previously 3.6 x 2.3 x 2.9 cm when measured in a similar fashion. Surrounding vasogenic edema is mildly increased and more well-defined as compared to prior. Mild mass effect on the adjacent posterior left lateral ventricle. No other significant regional mass effect or midline shift. No intraventricular or subdural extension in the interim. No other new acute intracranial hemorrhage. No acute large vessel territory infarct elsewhere. Atrophy with chronic microvascular ischemic disease again noted. No hydrocephalus or trapping. No extra-axial fluid collection. Vascular: No hyperdense vessel. Calcified atherosclerosis present at skull base. Skull: Scalp soft tissues and calvarium demonstrate no new abnormality. Sinuses/Orbits: Chronic postoperative changes noted at the right globe. Globes and orbital soft tissues demonstrate no acute finding. Visualized paranasal sinuses and mastoid air cells remain clear. Other: None. IMPRESSION: 1. No significant interval change in size of left posterior temporal intraparenchymal hemorrhage, estimated volume 11 cc. Mildly increased surrounding vasogenic edema without significant regional mass effect or midline shift. No interval subdural or intraventricular extension. 2. No other new acute intracranial abnormality. Electronically Signed   By: Jeannine Boga M.D.   On: 03/31/2020 00:29   CT HEAD WO CONTRAST  Result Date: 03/27/2020 CLINICAL DATA:  86 year old male with altered mental status. EXAM: CT HEAD WITHOUT CONTRAST TECHNIQUE: Contiguous axial images were obtained from the base of the skull through the vertex without intravenous contrast. COMPARISON:  None. FINDINGS: Brain: Left posterior temporal lobe intraparenchymal hemorrhage measures approximately 3.4 x 1.8 cm in greatest axial dimensions and 2.3 cm in craniocaudal length.  There is mild surrounding edema. There is mild age-related atrophy and chronic microvascular ischemic changes. There is no midline shift. No extra-axial fluid collection. Vascular: No hyperdense vessel or unexpected calcification. Skull: Normal. Negative for fracture or focal lesion. Sinuses/Orbits: No acute finding. Other: None IMPRESSION: 1. 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 Further evaluation with MRI recommended. No midline shift. 2. Mild chronic microvascular ischemic changes. These results were called by telephone at the time of interpretation on 03/27/2020 at 7:34 pm to provider Carmin Muskrat , who verbally acknowledged these results. Electronically Signed   By: Anner Crete M.D.   On: 03/27/2020 19:37   CT ANGIO NECK W OR WO CONTRAST  Result Date: 03/28/2020 CLINICAL DATA:  Follow-up parenchymal hemorrhage EXAM: CT ANGIOGRAPHY HEAD AND NECK TECHNIQUE: Multidetector CT imaging of the head and neck was performed using the standard protocol during bolus administration of intravenous contrast. Multiplanar CT image reconstructions and MIPs were obtained to evaluate the vascular anatomy. Carotid stenosis measurements (when applicable) are obtained utilizing NASCET criteria, using the distal internal carotid diameter as the denominator. CONTRAST:  54mL OMNIPAQUE IOHEXOL 350 MG/ML SOLN COMPARISON:  Head CT from yesterday FINDINGS: CT HEAD FINDINGS Brain: Parenchymal hemorrhage in the posterior left temporal lobe, extending to the occipital lobe, with rim of edema. The shape is unchanged and dimensions are also essentially stable at up to 35 x 18 mm on axial slices. Cerebral volume loss in keeping with age. Vascular: See below Skull: Normal. Negative for fracture or focal lesion. Sinuses: Imaged portions are clear. Orbits: Cataract resection and scleral band on the right. Review of the MIP images confirms the above findings CTA NECK  FINDINGS Aortic arch: No acute finding where covered. Three vessel branching. Right carotid system: Mild atheromatous plaque about the bifurcation, especially for age. No stenosis or ulceration. Left carotid system: Mild atheromatous plaque about the bifurcation, especially for age. No stenosis or ulceration. Vertebral arteries: No proximal subclavian or brachiocephalic stenosis. Codominant vertebral arteries which are tortuous but smoothly contoured. No vertebral stenosis. Skeleton: Diffuse degenerative disc disease with disc collapse and endplate erosion. Heterogeneous bony density without discrete lesion, likely osteopenic. A similar appearance was seen on the 2020 abdominal and lower extremity CT. Other neck: No acute finding. Upper chest: No acute finding Review of the MIP images confirms the above findings CTA HEAD FINDINGS Anterior circulation: Atheromatous calcification of the carotid siphons which is mild. No branch occlusion, beading, or aneurysm. No abnormal enhancement at the level of the hematoma. Posterior circulation: The vertebral and basilar arteries are smooth and widely patent. Incidental proximal basilar fenestration. No branch occlusion, beading, or aneurysm. No abnormal enhancement at the level of the hematoma. Venous sinuses: Diffusely patent Anatomic variants: As above Review of the MIP images confirms the above findings IMPRESSION: 1. No evidence of vascular lesion or mass underlying the patient's parenchymal hemorrhage. The hematoma is nonprogressive from yesterday. 2. Mild for age atherosclerosis. Electronically Signed   By: Monte Fantasia M.D.   On: 03/28/2020 11:19   MR BRAIN W WO CONTRAST  Result Date: 03/28/2020 CLINICAL DATA:  Cerebral parenchymal hemorrhage follow-up. EXAM: MRI HEAD WITHOUT AND  WITH CONTRAST TECHNIQUE: Multiplanar, multiecho pulse sequences of the brain and surrounding structures were obtained without and with intravenous contrast. CONTRAST:  28mL GADAVIST  GADOBUTROL 1 MMOL/ML IV SOLN COMPARISON:  CT head 03/27/2020 FINDINGS: Brain: Acute hematoma left temporoparietal lobe unchanged in size measuring approximately 19 x 40 mm. Moderate surrounding edema. Slight peripheral enhancement postcontrast infusion. No evidence of underlying mass lesion. Several additional areas of chronic microhemorrhage in both cerebral hemispheres. Approximately 5 additional areas of chronic microhemorrhage. Mild atrophy. Negative for acute ischemic infarct. Mild white matter changes. No mass lesion identified. Vascular: Normal arterial flow voids. Skull and upper cervical spine: No focal skeletal lesion. Sinuses/Orbits: Paranasal sinuses clear. Ocular surgery on the right. No orbital mass. Other: None IMPRESSION: Acute hematoma left temporoparietal lobe unchanged with surrounding edema. 5 additional foci of chronic microhemorrhage in the brain. 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 No acute ischemic infarct. Mild chronic microvascular ischemic changes in the white matter. Electronically Signed   By: Franchot Gallo M.D.   On: 03/28/2020 13:25   DG Chest Port 1 View  Result Date: 03/27/2020 CLINICAL DATA:  Change in mental status EXAM: PORTABLE CHEST 1 VIEW COMPARISON:  November 10, 2008 FINDINGS: The heart size and mediastinal contours are within normal limits. Aortic knob calcifications are seen. Both lungs are clear. The visualized skeletal structures are unremarkable. IMPRESSION: No active disease. Electronically Signed   By: Prudencio Pair M.D.   On: 03/27/2020 19:25   ECHOCARDIOGRAM COMPLETE  Result Date: 03/28/2020    ECHOCARDIOGRAM REPORT   Patient Name:   Ryan Adams Date of Exam: 03/28/2020 Medical Rec #:  [id number]      Height:       72.0 in Accession #:    [id number]     Weight:       126.5 lb Date of Birth:  Apr 21, 1928       BSA:          1.754 m Patient Age:    75 years       BP:           119/87 mmHg Patient Gender: M              HR:           84 bpm.  Exam Location:  Inpatient Procedure: 2D Echo, Cardiac Doppler and Color Doppler Indications:    Stroke 434.91 / I163.9  History:        Patient has no prior history of Echocardiogram examinations.  Sonographer:    Vickie Epley RDCS Referring Phys: [id number] ASHISH ARORA  Sonographer Comments: Technically difficult study due to poor echo windows. Image acquisition challenging due to patient body habitus. IMPRESSIONS  1. Left ventricular ejection fraction, by estimation, is 60 to 65%. The left ventricle has normal function. Left ventricular endocardial border not optimally defined to evaluate regional wall motion. There is moderate asymmetric left ventricular hypertrophy of the basal-septal segment. Left ventricular diastolic parameters are indeterminate.  2. Right ventricular systolic function is normal. The right ventricular size is normal. There is normal pulmonary artery systolic pressure. The estimated right ventricular systolic pressure is 96.2 mmHg.  3. The mitral valve is normal in structure. Trivial mitral valve regurgitation. No evidence of mitral stenosis. Moderate mitral annular calcification.  4. The aortic valve is tricuspid. Aortic valve regurgitation is trivial. No aortic stenosis is present by visual assessment, Doppler alignment is suboptimal.  5. Aortic dilatation noted. There is mild dilatation of the aortic root, measuring 42  mm.  6. The inferior vena cava is normal in size with greater than 50% respiratory variability, suggesting right atrial pressure of 3 mmHg. Conclusion(s)/Recommendation(s): No intracardiac source of embolism detected on this transthoracic study. A transesophageal echocardiogram is recommended to exclude cardiac source of embolism if clinically indicated. FINDINGS  Left Ventricle: Left ventricular ejection fraction, by estimation, is 60 to 65%. The left ventricle has normal function. Left ventricular endocardial border not optimally defined to evaluate regional wall motion. The  left ventricular internal cavity size was normal in size. There is moderate asymmetric left ventricular hypertrophy of the basal-septal segment. Left ventricular diastolic parameters are indeterminate. Right Ventricle: The right ventricular size is normal. No increase in right ventricular wall thickness. Right ventricular systolic function is normal. There is normal pulmonary artery systolic pressure. The tricuspid regurgitant velocity is 2.58 m/s, and  with an assumed right atrial pressure of 3 mmHg, the estimated right ventricular systolic pressure is 29.5 mmHg. Left Atrium: Left atrial size was not well visualized. Right Atrium: Right atrial size was not well visualized. Pericardium: Trivial pericardial effusion is present. Mitral Valve: The mitral valve is normal in structure. Moderate mitral annular calcification. Trivial mitral valve regurgitation. No evidence of mitral valve stenosis. Tricuspid Valve: The tricuspid valve is normal in structure. Tricuspid valve regurgitation is mild . No evidence of tricuspid stenosis. Aortic Valve: The aortic valve is tricuspid. Aortic valve regurgitation is trivial. No aortic stenosis is present. Pulmonic Valve: The pulmonic valve was not well visualized. Pulmonic valve regurgitation is not visualized. No evidence of pulmonic stenosis. Aorta: Aortic dilatation noted and the ascending aorta was not well visualized. There is mild dilatation of the aortic root, measuring 42 mm. Venous: The inferior vena cava is normal in size with greater than 50% respiratory variability, suggesting right atrial pressure of 3 mmHg. IAS/Shunts: The interatrial septum was not well visualized.  LEFT VENTRICLE PLAX 2D LVIDd:         4.80 cm  Diastology LVIDs:         3.50 cm  LV e' medial:    4.29 cm/s LV PW:         0.80 cm  LV E/e' medial:  14.8 LV IVS:        0.80 cm  LV e' lateral:   5.87 cm/s LVOT diam:     2.10 cm  LV E/e' lateral: 10.8 LV SV:         42 LV SV Index:   24 LVOT Area:     3.46  cm  RIGHT VENTRICLE RV S prime:     23.30 cm/s TAPSE (M-mode): 2.4 cm LEFT ATRIUM           Index       RIGHT ATRIUM           Index LA diam:      4.40 cm 2.51 cm/m  RA Area:     14.40 cm LA Vol (A4C): 33.1 ml 18.87 ml/m RA Volume:   35.80 ml  20.41 ml/m  AORTIC VALVE LVOT Vmax:   54.40 cm/s LVOT Vmean:  38.800 cm/s LVOT VTI:    0.121 m  AORTA Ao Root diam: 4.20 cm MITRAL VALVE               TRICUSPID VALVE MV Area (PHT): 3.37 cm    TR Peak grad:   26.6 mmHg MV Decel Time: 225 msec    TR Vmax:        258.00 cm/s MV  E velocity: 63.30 cm/s MV A velocity: 77.10 cm/s  SHUNTS MV E/A ratio:  0.82        Systemic VTI:  0.12 m                            Systemic Diam: 2.10 cm Cherlynn Kaiser MD Electronically signed by Cherlynn Kaiser MD Signature Date/Time: 03/28/2020/9:50:42 AM    Final     PHYSICAL EXAM  Temp:  [98.3 F (36.8 C)-99 F (37.2 C)] 98.3 F (36.8 C) (11/19 0333) Pulse Rate:  [72-102] 96 (11/19 0333) Resp:  [18-20] 18 (11/19 0333) BP: (113-157)/(69-102) 154/102 (11/19 0333) SpO2:  [96 %-100 %] 96 % (11/19 0333)  General - Well nourished, well developed, in no apparent distress, mild restless.  Ophthalmologic - fundi not visualized due to noncooperation.  Cardiovascular - Regular rhythm and rate.  Neuro - Awake alert, eyes open, able to answer his name and age, not oriented to place states he is at home, is not oriented to year or month or sitaution. Hard of hearing. Able to follow simple commands but psychomotor slowing. Blinking to visual threat bilaterally, able to track both sides. Able to gaze bilaterally. No facial droop. PERRL. Moving BUEs, symmetric, no drift. Moving BLEs, 3/5 symmetric. Not cooperative with FTN. Gait not tested   ASSESSMENT/PLAN Mr. UMER HARIG is a 86 y.o. male with history of diabetes and hypertension admitted for confusion, aphasia. No tPA given due to [place].    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  Chronic microhemorrhages in the brain suggesting CAA versus hypertension.  No acute infarct.  CT head and neck unremarkable  Repeat CTH stable ICH   2D Echo EF 60 to 65%  LDL 171  HgbA1c 6.3  Heparin subcu for VTE prophylaxis  No antithrombotic prior to admission, now on No antithrombotic due to [place] and possible CAA  Ongoing aggressive stroke risk factor management  Therapy recommendations: SNF 24/7 supervision   Disposition: Pending  Diabetes  HgbA1c 6.3 goal < 7.0  Controlled  CBG monitoring  SSI  DM education and close PCP follow up  Hypertension . Home meds - amlodipine, lotensin and metoprolol . Stable . On low-dose Cleviprex, taper off as able . Home lotensin and metoprolol have been resumed this admission   Long term BP goal normotensive  Hyperlipidemia  Home meds: None  LDL 171, goal < 70  No statin now given ICH   Consider statin at discharge   Agitation  Likely due to [place] in the setting of underlying cognitive impairment  Haldol and Ativanas needed  Seroquel 25mg  BID -> can increase dose if not effective  Sitter PRN  Other Stroke Risk Factors  Advanced age   Hospital day # 4  Neurology will sign off. Please call with questions. Pt will follow up with stroke clinic NP at [facility] in about 4 weeks. Thanks for the consult.  Rosalin Hawking, MD PhD Stroke Neurology 03/31/2020 2:13 PM      To contact Stroke Continuity provider, please refer to [email or web address] After hours, contact General Neurology

## 2020-03-31 NOTE — Progress Notes (Signed)
Physical Therapy Treatment Patient Details Name: Ryan Adams MRN: [id number] DOB: [birth year] Today's Date: 03/31/2020    History of Present Illness Pt is 86 year old male with past history of diabetes, hypertension, with sudden onset of confusion.  On examination revealed dysarthria, expressive aphasia with no focal motor or sensory deficits. 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      PT Comments    Pt tolerates treatment well with improved activity tolerance and balance. Pt requries much less physical assistance to mobilize this session and is able to follow one-step commands consistently at this time. Pt has limited awareness of his current mobility deficits and falls risk, and will benefit from continued acute PT services to improve balance and mobility quality. PT continues to recommend SNF placement as the pt reports no caregiver support and remains at a high falls risk.   Follow Up Recommendations  SNF;Supervision/Assistance - 24 hour     Equipment Recommendations  Rolling walker with 5" wheels;Other (comment)    Recommendations for Other Services       Precautions / Restrictions Precautions Precautions: Fall Restrictions Weight Bearing Restrictions: No    Mobility  Bed Mobility Overal bed mobility: Needs Assistance Bed Mobility: Supine to Sit     Supine to sit: Supervision        Transfers Overall transfer level: Needs assistance Equipment used: 1 person hand held assist;Rolling walker (2 wheeled) Transfers: Sit to/from [facility] Sit to Stand: Min assist Stand pivot transfers: Min assist       General transfer comment: minA SST with PT HHA, posterior trunk lean  Ambulation/Gait Ambulation/Gait assistance: Min assist Gait Distance (Feet): 30 Feet Assistive device: Rolling walker (2 wheeled) Gait Pattern/deviations: Step-to pattern Gait velocity: reduced Gait velocity interpretation: <1.31 ft/sec,  indicative of household ambulator General Gait Details: pt with short step-to gait, reduced step length and intermittent increase in lateral sway requiring PT assist to prevent LOB   Stairs             Wheelchair Mobility    Modified Rankin (Stroke Patients Only) Modified Rankin (Stroke Patients Only) Pre-Morbid Rankin Score: No symptoms Modified Rankin: Moderately severe disability     Balance Overall balance assessment: Needs assistance Sitting-balance support: No upper extremity supported;Feet supported Sitting balance-Leahy Scale: Fair     Standing balance support: Bilateral upper extremity supported;Single extremity supported Standing balance-Leahy Scale: Poor Standing balance comment: reliant on UE support or PT assistance                            Cognition Arousal/Alertness: Awake/alert Behavior During Therapy: WFL for tasks assessed/performed Overall Cognitive Status: Impaired/Different from baseline Area of Impairment: Memory;Safety/judgement;Awareness;Problem solving                     Memory: Decreased recall of precautions;Decreased short-term memory Following Commands: Follows one step commands consistently;Follows multi-step commands inconsistently Safety/Judgement: Decreased awareness of deficits;Decreased awareness of safety Awareness: Intellectual Problem Solving: Slow processing;Difficulty sequencing        Exercises General Exercises - Lower Extremity Ankle Circles/Pumps: AROM;Both;20 reps Gluteal Sets: AROM;Both;10 reps Straight Leg Raises: AROM;Both;15 reps    General Comments General comments (skin integrity, edema, etc.): VSS on RA      Pertinent Vitals/Pain Pain Assessment: No/denies pain    Home Living                      Prior Function  PT Goals (current goals can now be found in the care plan section) Acute Rehab PT Goals Patient Stated Goal: agreeable to working with  therapy Progress towards PT goals: Progressing toward goals    Frequency    Min 3X/week      PT Plan Current plan remains appropriate    Co-evaluation              AM-PAC PT "6 Clicks" Mobility   Outcome Measure  Help needed turning from your back to your side while in a flat bed without using bedrails?: None Help needed moving from lying on your back to sitting on the side of a flat bed without using bedrails?: None Help needed moving to and from a bed to a chair (including a wheelchair)?: A Little Help needed standing up from a chair using your arms (e.g., wheelchair or bedside chair)?: A Little Help needed to walk in hospital room?: A Little Help needed climbing 3-5 steps with a railing? : A Lot 6 Click Score: 19    End of Session   Activity Tolerance: Patient tolerated treatment well Patient left: in chair;with call bell/phone within reach;with chair alarm set;with nursing/sitter in room Nurse Communication: Mobility status PT Visit Diagnosis: Unsteadiness on feet (R26.81);Other abnormalities of gait and mobility (R26.89);Other symptoms and signs involving the nervous system (R29.898)     Time: 9528-4132 PT Time Calculation (min) (ACUTE ONLY): 23 min  Charges:  $Gait Training: 8-22 mins $Therapeutic Activity: 8-22 mins                     [name], PT, DPT Acute Rehabilitation Pager: [phone]    [name] 03/31/2020, 10:38 AM

## 2020-03-31 NOTE — Progress Notes (Addendum)
PROGRESS NOTE  Ryan Adams  DOB: [birth year]  PCP: Neale Burly, MD UXN:[id number]  DOA: 03/27/2020  LOS: 4 days   Chief Complaint  Patient presents with  . Altered Mental Status    Brief narrative: Ryan Adams is a 86 y.o. male with past medical history significant for diabetes mellitus and hypertension. Patient was brought to ED at [facility] by family on 03/27/2020 for evaluation of confusion. Last known normal 2 days ago.  For the preceding 2 days, patient was not able to answer any questions to the family.  Family also noted some bruising in his left temple but he did not have a witnessed fall.  Up until last Saturday-03/25/2020-living independently, still driving. Brain imaging showed a left posterior temporal ICH.   Patient was transferred to [facility] and admitted under neurology service. Transferred to hospital service on 11/18.  Subjective: Patient was seen and examined this morning.  Elderly African-American male.  Propped up in bed.  Being evaluated by speech therapy.  She is cheerful.  Able to follow motor commands but with difficulty.    Assessment/Plan: Acute intracranial hemorrhage -CT scan of head with left temporal ICH, hypertensive vs CAA -MRI acute hematoma left temporoparietal lobe, unchanged.  Chronic microhemorrhages.  -CT angio head and neck unremarkable. -Echo with EF 60 to 65%, -LDL elevated to 171, A1c 6.3. -Neurology advised against antithrombotic because of ICH. -PT eval obtained.  Recommend SNF. -Dysphagia is improving.  Delirium/acute encephalopathy -Related to [place].  Prior to admission, patient was functional but had underlying risk factors for dementia. -In the hospital, he has had episodes of agitation and confusion.  Currently controlled on Seroquel 25 mg twice daily and PRN Haldol and Ativan.  AKI -Probably related to poor oral hydration. -Continue normal saline at 75 mils per hour.  Keep benazepril on hold. -Continue to trend  creatinine. Recent Labs    03/27/20 2105 03/29/20 0047 03/30/20 0337 03/31/20 0318  BUN 22 17 19 14   CREATININE 1.29* 0.94 1.29* 1.33*   Hypokalemia -Potassium low at 3.2 on 11/17.  Replaced.  Normalized. Recent Labs  Lab 03/27/20 2105 03/29/20 0047 03/30/20 0337 03/31/20 0318  K 3.9 3.3* 4.0 3.9   Diabetes mellitus type 2 -A1c 6.3. -Continue sliding-scale insulin with Accu-Cheks. Recent Labs  Lab 03/30/20 1200 03/30/20 1633 03/30/20 2121 03/31/20 0629 03/31/20 1300  GLUCAP 115* 106* 89 96 160*   Essential hypertension -Currently on metoprolol. Keep benazepril on hold.  Hyperlipidemia -LDL 171, goal less than 70. Consider statin at discharge.  Underweight Body mass index is 15.22 kg/m. -Nutrition consult  Mobility: PT eval obtained.  SNF recommended. Code Status:   Code Status: Full Code  Nutritional status: Body mass index is 15.22 kg/m.     Diet Order            Diet regular Room service appropriate? Yes with Assist; Fluid consistency: Thin  Diet effective now                 DVT prophylaxis: heparin injection 5,000 Units Start: 03/28/20 1500 SCD's Start: 03/28/20 0357   Antimicrobials:  None Fluid: None Consultants: Neurology Family Communication:  None at bedside  Status is: Inpatient  Remains inpatient appropriate because -pending SNF placement   Dispo: The patient is from: Home              Anticipated d/c is to: SNF              Anticipated d/c date is:  Whenever bed is available              Patient currently is medically stable to d/c.    Infusions:  . sodium chloride 75 mL/hr at 03/31/20 1255    Scheduled Meds: . Chlorhexidine Gluconate Cloth  6 each Topical Daily  . ferrous sulfate  325 mg Oral BID WC  . heparin injection (subcutaneous)  5,000 Units Subcutaneous Q12H  . insulin aspart  0-5 Units Subcutaneous QHS  . insulin aspart  0-9 Units Subcutaneous TID WC  . metoprolol tartrate  25 mg Oral BID  . pantoprazole  sodium  40 mg Oral Daily  . QUEtiapine  25 mg Oral BID  . senna-docusate  1 tablet Oral BID    Antimicrobials: Anti-infectives (From admission, onward)   None      PRN meds: acetaminophen **OR** acetaminophen (TYLENOL) oral liquid 160 mg/5 mL **OR** acetaminophen, haloperidol lactate, labetalol, LORazepam   Objective: Vitals:   03/31/20 0827 03/31/20 1157  BP: (!) 152/86 133/84  Pulse: 84 74  Resp: 16 16  Temp: 97.6 F (36.4 C) (!) 97.5 F (36.4 C)  SpO2: 99% 99%    Intake/Output Summary (Last 24 hours) at 03/31/2020 1525 Last data filed at 03/31/2020 1100 Gross per 24 hour  Intake 1028.48 ml  Output 300 ml  Net 728.48 ml   Filed Weights   03/27/20 1859 03/29/20 0900  Weight: 57.4 kg 50.9 kg   Weight change:  Body mass index is 15.22 kg/m.   Physical Exam: General exam: Not in physical distress.  Comfortable Skin: No rashes, lesions or ulcers. HEENT: Atraumatic, normocephalic, supple neck, no obvious bleeding Lungs: Clear to auscultation bilaterally CVS: Regular rate and rhythm, no murmur GI/Abd soft, nontender, nondistended, bowel sound present CNS: Alert, awake, not oriented, dysarthric, able to follow motor commands with difficulty. Psychiatry: Mood appropriate Extremities: No pedal edema, no calf tenderness  Data Review: I have personally reviewed the laboratory data and studies available.  Recent Labs  Lab 03/27/20 2105 03/29/20 0047 03/30/20 0337 03/31/20 0318  WBC 6.7 6.6 5.7 4.6  HGB 10.3* 10.2* 10.7* 11.2*  HCT 32.5* 32.1* 32.7* 34.7*  MCV 78.1* 77.2* 76.4* 77.3*  PLT 190 195 183 228   Recent Labs  Lab 03/27/20 2105 03/29/20 0047 03/30/20 0337 03/31/20 0318  NA 132* 136 142 143  K 3.9 3.3* 4.0 3.9  CL 97* 103 111 110  CO2 25 22 20* 24  GLUCOSE 104* 93 84 97  BUN 22 17 19 14   CREATININE 1.29* 0.94 1.29* 1.33*  CALCIUM 8.9 8.7* 9.0 8.9    F/u labs ordered  Signed, Terrilee Croak, MD [facility] 03/31/2020

## 2020-04-01 DIAGNOSIS — I10 Essential (primary) hypertension: Secondary | ICD-10-CM | POA: Diagnosis not present

## 2020-04-01 LAB — GLUCOSE, CAPILLARY
Glucose-Capillary: 91 mg/dL (ref 70–99)
Glucose-Capillary: 81 mg/dL (ref 70–99)
Glucose-Capillary: 99 mg/dL (ref 70–99)

## 2020-04-01 LAB — BASIC METABOLIC PANEL
Anion gap: 10 (ref 5–15)
BUN: 11 mg/dL (ref 8–23)
CO2: 25 mmol/L (ref 22–32)
Calcium: 9 mg/dL (ref 8.9–10.3)
Chloride: 106 mmol/L (ref 98–111)
Creatinine, Ser: 1.18 mg/dL (ref 0.61–1.24)
GFR, Estimated: 59 mL/min — ABNORMAL LOW (ref >60)
Glucose, Bld: 121 mg/dL — ABNORMAL HIGH (ref 70–99)
Potassium: 3.3 mmol/L — ABNORMAL LOW (ref 3.5–5.1)
Sodium: 141 mmol/L (ref 135–145)

## 2020-04-01 LAB — CBC
HCT: 35.6 % — ABNORMAL LOW (ref 39.0–52.0)
Hemoglobin: 11.3 g/dL — ABNORMAL LOW (ref 13.0–17.0)
MCH: 24.4 pg — ABNORMAL LOW (ref 26.0–34.0)
MCHC: 31.7 g/dL (ref 30.0–36.0)
MCV: 76.7 fL — ABNORMAL LOW (ref 80.0–100.0)
Platelets: 204 10*3/uL (ref 150–400)
RBC: 4.64 MIL/uL (ref 4.22–5.81)
RDW: 15.4 % (ref 11.5–15.5)
WBC: 4.9 10*3/uL (ref 4.0–10.5)
nRBC: 0 % (ref 0.0–0.2)

## 2020-04-01 MED ORDER — BENAZEPRIL HCL 20 MG PO TABS
20.0000 mg | ORAL_TABLET | Freq: Every day | ORAL | Status: DC
  Administered 2020-04-01 – 2020-04-03 (×3): 20 mg via ORAL
  Filled 2020-04-01 (×3): qty 1

## 2020-04-01 MED ORDER — POTASSIUM CHLORIDE CRYS ER 20 MEQ PO TBCR
40.0000 meq | EXTENDED_RELEASE_TABLET | Freq: Once | ORAL | Status: AC
  Administered 2020-04-01: 40 meq via ORAL
  Filled 2020-04-01: qty 2

## 2020-04-01 MED ORDER — ENSURE ENLIVE PO LIQD
237.0000 mL | Freq: Two times a day (BID) | ORAL | Status: DC
  Administered 2020-04-01 – 2020-04-03 (×4): 237 mL via ORAL

## 2020-04-01 NOTE — Progress Notes (Signed)
PROGRESS NOTE  Ryan Adams  DOB: 12-12-1927  PCP: Neale Burly, MD JZP:[id number]  DOA: 03/27/2020  LOS: 5 days   Chief Complaint  Patient presents with  . Altered Mental Status    Brief narrative: Ryan Adams is a 86 y.o. male with past medical history significant for diabetes mellitus and hypertension. Patient was brought to ED at [facility] by family on 03/27/2020 for evaluation of confusion. Last known normal 2 days ago.  For the preceding 2 days, patient was not able to answer any questions to the family.  Family also noted some bruising in his left temple but he did not have a witnessed fall.  Up until last Saturday-03/25/2020-living independently, still driving. Brain imaging showed a left posterior temporal ICH.   Patient was transferred to [facility] and admitted under neurology service. Transferred to hospital service on 11/18.  Subjective: Patient was seen and examined this morning.   Sleeping.  Tries to open eyes on verbal command.  Not in distress.    Assessment/Plan: Acute intracranial hemorrhage -CT scan of head with left temporal ICH, hypertensive vs CAA -MRI acute hematoma left temporoparietal lobe, unchanged.  Chronic microhemorrhages.  -CT angio head and neck unremarkable. -Echo with EF 60 to 65%, -LDL elevated to 171, A1c 6.3. -Neurology advised against antithrombotic because of ICH. -PT eval obtained.  Recommend SNF. -Dysphagia is improving.  Currently on regular diet.  Delirium/acute encephalopathy -Related to [place].  Prior to admission, patient was functional but had underlying risk factors for dementia. -In the hospital, he has had episodes of agitation and confusion.  Currently controlled on Seroquel 25 mg twice daily and PRN Haldol and Ativan.  AKI -Probably related to poor oral hydration. -Creatinine improved.  Stop IV fluid.  Encourage oral hydration.   -Continue to trend creatinine. Recent Labs    03/27/20 2105 03/29/20 0047 03/30/20 0337  03/31/20 0318 04/01/20 0246  BUN 22 17 19 14 11   CREATININE 1.29* 0.94 1.29* 1.33* 1.18   Hypokalemia -Potassium low at 3.3 today.  40 mEq oral replacement given. Recent Labs  Lab 03/27/20 2105 03/29/20 0047 03/30/20 0337 03/31/20 0318 04/01/20 0246  K 3.9 3.3* 4.0 3.9 3.3*   Diabetes mellitus type 2 -A1c 6.3. -Continue sliding-scale insulin with Accu-Cheks. Recent Labs  Lab 03/31/20 0629 03/31/20 1300 03/31/20 1620 03/31/20 2119 04/01/20 0613  GLUCAP 96 160* 93 93 91   Essential hypertension -Home meds include amlodipine 5 mg daily, metoprolol 50 mg daily, benazepril 20 mg daily.   -Currently on metoprolol 25 mg twice daily only.  Blood pressure trending up.  Resume Benicar 20 mg daily today. -Continue to monitor blood pressure.  Hyperlipidemia -LDL 171, goal less than 70. Consider statin at discharge.  Underweight Body mass index is 15.22 kg/m. -Nutrition consult  Mobility: PT eval obtained.  SNF recommended. Code Status:   Code Status: Full Code  Nutritional status: Body mass index is 15.22 kg/m.     Diet Order            Diet regular Room service appropriate? Yes with Assist; Fluid consistency: Thin  Diet effective now                 DVT prophylaxis: heparin injection 5,000 Units Start: 03/28/20 1500 SCD's Start: 03/28/20 0357   Antimicrobials:  None Fluid: None Consultants: Neurology Family Communication:  None at bedside  Status is: Inpatient  Remains inpatient appropriate because -pending SNF placement   Dispo: The patient is from: Home  Anticipated d/c is to: SNF              Anticipated d/c date is: Whenever bed is available              Patient currently is medically stable to d/c.    Infusions:    Scheduled Meds: . benazepril  20 mg Oral Daily  . Chlorhexidine Gluconate Cloth  6 each Topical Daily  . ferrous sulfate  325 mg Oral BID WC  . heparin injection (subcutaneous)  5,000 Units Subcutaneous Q12H  .  insulin aspart  0-5 Units Subcutaneous QHS  . insulin aspart  0-9 Units Subcutaneous TID WC  . metoprolol tartrate  25 mg Oral BID  . pantoprazole sodium  40 mg Oral Daily  . QUEtiapine  25 mg Oral BID  . senna-docusate  1 tablet Oral BID    Antimicrobials: Anti-infectives (From admission, onward)   None      PRN meds: acetaminophen **OR** acetaminophen (TYLENOL) oral liquid 160 mg/5 mL **OR** acetaminophen, haloperidol lactate, labetalol, LORazepam   Objective: Vitals:   04/01/20 0558 04/01/20 0825  BP: 99/65 (!) 141/77  Pulse: 72 (!) 59  Resp:  14  Temp: 98 F (36.7 C) 98.1 F (36.7 C)  SpO2: 100% 97%    Intake/Output Summary (Last 24 hours) at 04/01/2020 1130 Last data filed at 03/31/2020 1858 Gross per 24 hour  Intake 120 ml  Output 500 ml  Net -380 ml   Filed Weights   03/27/20 1859 03/29/20 0900  Weight: 57.4 kg 50.9 kg   Weight change:  Body mass index is 15.22 kg/m.   Physical Exam: General exam: Not in physical distress.  Comfortable Skin: No rashes, lesions or ulcers. HEENT: Atraumatic, normocephalic, supple neck, no obvious bleeding Lungs: Clear to auscultation bilaterally CVS: Regular rate and rhythm, no murmur GI/Abd soft, nontender, nondistended, bowel sound present CNS: Sleeping, arousable, not in distress.   Psychiatry: Mood appropriate Extremities: No pedal edema, no calf tenderness  Data Review: I have personally reviewed the laboratory data and studies available.  Recent Labs  Lab 03/27/20 2105 03/29/20 0047 03/30/20 0337 03/31/20 0318 04/01/20 0246  WBC 6.7 6.6 5.7 4.6 4.9  HGB 10.3* 10.2* 10.7* 11.2* 11.3*  HCT 32.5* 32.1* 32.7* 34.7* 35.6*  MCV 78.1* 77.2* 76.4* 77.3* 76.7*  PLT 190 195 183 228 204   Recent Labs  Lab 03/27/20 2105 03/29/20 0047 03/30/20 0337 03/31/20 0318 04/01/20 0246  NA 132* 136 142 143 141  K 3.9 3.3* 4.0 3.9 3.3*  CL 97* 103 111 110 106  CO2 25 22 20* 24 25  GLUCOSE 104* 93 84 97 121*  BUN 22  17 19 14 11   CREATININE 1.29* 0.94 1.29* 1.33* 1.18  CALCIUM 8.9 8.7* 9.0 8.9 9.0    F/u labs ordered  Signed, Terrilee Croak, MD [facility] 04/01/2020

## 2020-04-01 NOTE — Progress Notes (Signed)
Initial Nutrition Assessment  DOCUMENTATION CODES:   Underweight (Suspect PCM)  INTERVENTION:  Ensure Enlive po BID, each supplement provides 350 kcal and 20 grams of protein  Feeding assistance with meals provided by nursing staff  NUTRITION DIAGNOSIS:   Increased nutrient needs related to acute illness as evidenced by estimated needs.   GOAL:   Patient will meet greater than or equal to 90% of their needs    MONITOR:   Labs, I & O's, Supplement acceptance, PO intake, Weight trends, Skin  REASON FOR ASSESSMENT:   Consult Assessment of nutrition requirement/status  ASSESSMENT:  RD working remotely.  86 year old male admitted for acute left temporal ICH. Past medical history of DM,  HTN and until 03/25/20 pt was living independently, driving, and independent with ADL's who was brought in by family for evaluation of confusion, expressive aphasia and bruising to left temple concerning for possible unwitnessed fall.  11/15-admit to [facility] 11/18-transfer to [facility] under neurology service  RD unable to contact pt via phone to obtain nutrition history at this time secondary to aphasia and noted hard of hearing. Per notes, communication is improving, pt able to follow simple one-step commands, knows name and age, but is not oriented to time or place. Per ST, aphasia has evolved to a posterior, primary receptive aphasia. He is tolerating regular diet. Meal intake has been fair, eating 0-85% (55% average x 5 documented intakes). Will order Ensure BID to aid with meeting needs.  Current wt 50.9 kg (111.98 lbs) No weight history for review. Question accuracy of 11/15 admit wt 57.4 kg (126.54 lbs). Patient is underweight and given advanced age, suspect degree of malnutrition, however unable to identify at this time. Will plan to complete exam at follow-up.  I/Os: +2575.8 ml since admit UOP: 500 ml x 24 hrs  Medications reviewed and include: Ferrous sulfate, SSI, Protonix, Seroquel,  Senokot  Labs: CBGs 91,93,93,160, K 3.3 (L) A1c 6.3 (03/28/20)  Per notes: -no tpa d/t ICH -acute hematoma left temporoparietal lobe unchanged on MRI -chronic microhemorrhages in brain; CAA vs HTN -outpt follow-up with stroke clinic, neurology signing off -sitter PRN for agitation -SNF  24/7 supervision at d/c   NUTRITION - FOCUSED PHYSICAL EXAM: Unable to complete at this time, RD working remotely.  Diet Order:   Diet Order            Diet regular Room service appropriate? Yes with Assist; Fluid consistency: Thin  Diet effective now                 EDUCATION NEEDS:   No education needs have been identified at this time  Skin:  Skin Assessment: Skin Integrity Issues: Skin Integrity Issues:: Other (Comment) Other: ecchymosis; bilateral arm,hand  Last BM:  pta No documented BM x 4 days, on bowel regimen.  Height:   Ht Readings from Last 1 Encounters:  03/27/20 6' (1.829 m)    Weight:   Wt Readings from Last 1 Encounters:  03/29/20 50.9 kg    Ideal Body Weight:  80.9 kg  BMI:  Body mass index is 15.22 kg/m.  Estimated Nutritional Needs:   Kcal:  1600-1800  Protein:  70-80  Fluid:  >/= 1.3 L/day   Lajuan Lines, RD, LDN Clinical Nutrition After Hours/Weekend Pager # [phone]

## 2020-04-02 DIAGNOSIS — I10 Essential (primary) hypertension: Secondary | ICD-10-CM | POA: Diagnosis not present

## 2020-04-02 LAB — GLUCOSE, CAPILLARY
Glucose-Capillary: 86 mg/dL (ref 70–99)
Glucose-Capillary: 81 mg/dL (ref 70–99)
Glucose-Capillary: 94 mg/dL (ref 70–99)
Glucose-Capillary: 121 mg/dL — ABNORMAL HIGH (ref 70–99)

## 2020-04-02 LAB — BASIC METABOLIC PANEL
Anion gap: 14 (ref 5–15)
BUN: 14 mg/dL (ref 8–23)
CO2: 20 mmol/L — ABNORMAL LOW (ref 22–32)
Calcium: 9 mg/dL (ref 8.9–10.3)
Chloride: 107 mmol/L (ref 98–111)
Creatinine, Ser: 1.25 mg/dL — ABNORMAL HIGH (ref 0.61–1.24)
GFR, Estimated: 55 mL/min — ABNORMAL LOW (ref >60)
Glucose, Bld: 94 mg/dL (ref 70–99)
Potassium: 5.7 mmol/L — ABNORMAL HIGH (ref 3.5–5.1)
Sodium: 141 mmol/L (ref 135–145)

## 2020-04-02 LAB — CBC
HCT: 33.8 % — ABNORMAL LOW (ref 39.0–52.0)
Hemoglobin: 10.8 g/dL — ABNORMAL LOW (ref 13.0–17.0)
MCH: 24.7 pg — ABNORMAL LOW (ref 26.0–34.0)
MCHC: 32 g/dL (ref 30.0–36.0)
MCV: 77.3 fL — ABNORMAL LOW (ref 80.0–100.0)
Platelets: 189 10*3/uL (ref 150–400)
RBC: 4.37 MIL/uL (ref 4.22–5.81)
RDW: 15.4 % (ref 11.5–15.5)
WBC: 4.4 10*3/uL (ref 4.0–10.5)
nRBC: 0 % (ref 0.0–0.2)

## 2020-04-02 MED ORDER — AMLODIPINE BESYLATE 5 MG PO TABS
5.0000 mg | ORAL_TABLET | Freq: Every day | ORAL | Status: DC
  Administered 2020-04-02 – 2020-04-03 (×2): 5 mg via ORAL
  Filled 2020-04-02: qty 1

## 2020-04-02 NOTE — Plan of Care (Signed)

## 2020-04-02 NOTE — Progress Notes (Signed)
PROGRESS NOTE  Ryan Adams  DOB: 18-Aug-1927  PCP: Neale Burly, MD OIN:[id number]  DOA: 03/27/2020  LOS: 6 days   Chief Complaint  Patient presents with  . Altered Mental Status    Brief narrative: Ryan Adams is a 86 y.o. male with past medical history significant for diabetes mellitus and hypertension. Patient was brought to ED at [facility] by family on 03/27/2020 for evaluation of confusion. Last known normal 2 days ago.  For the preceding 2 days, patient was not able to answer any questions to the family.  Family also noted some bruising in his left temple but he did not have a witnessed fall.  Up until last Saturday-03/25/2020-living independently, still driving. Brain imaging showed a left posterior temporal ICH.   Patient was transferred to [facility] and admitted under neurology service. Transferred to hospital service on 11/18.  Subjective: Patient was seen and examined this morning.   Sleeping, arousable, not in distress.  No new complain.  Assessment/Plan: Acute intracranial hemorrhage -CT scan of head with left temporal ICH, hypertensive vs CAA -MRI acute hematoma left temporoparietal lobe, unchanged.  Chronic microhemorrhages.  -CT angio head and neck unremarkable. -Echo with EF 60 to 65%, -LDL elevated to 171, A1c 6.3. -Neurology advised against antithrombotic because of ICH. -PT eval obtained.  Recommend SNF. -Dysphagia is improving.  Currently on regular diet.  Delirium/acute encephalopathy -Related to [place].  Prior to admission, patient was functional but had underlying risk factors for dementia. -In the hospital, he has had episodes of agitation and confusion.  Currently controlled on Seroquel 25 mg twice daily.  As needed Haldol and Ativan available.  AKI -Probably related to poor oral hydration. -Creatinine improved.  Encourage oral hydration. Recent Labs    03/27/20 2105 03/29/20 0047 03/30/20 0337 03/31/20 0318 04/01/20 0246 04/02/20 0203  BUN  22 17 19 14 11 14   CREATININE 1.29* 0.94 1.29* 1.33* 1.18 1.25*   Hyperkalemia -Potassium level is elevated to 5.7 today.  He was hypokalemic yesterday and got oral replacement.  Patient not on a scheduled potassium or potassium sparing diuretics.   -Continue to monitor.   Recent Labs  Lab 03/29/20 0047 03/30/20 0337 03/31/20 0318 04/01/20 0246 04/02/20 0203  K 3.3* 4.0 3.9 3.3* 5.7*   Diabetes mellitus type 2 -A1c 6.3. -Continue sliding-scale insulin with Accu-Cheks. Recent Labs  Lab 03/31/20 2119 04/01/20 0613 04/01/20 1513 04/01/20 2121 04/02/20 0601  GLUCAP 93 91 81 99 86   Essential hypertension -Home meds include amlodipine 5 mg daily, metoprolol 50 mg daily, benazepril 20 mg daily.   -Currently on metoprolol and Benicar.  Blood pressure elevated up to 180s this morning.  I would resume amlodipine today.   -Continue to monitor blood pressure.  Hyperlipidemia -LDL 171, goal less than 70. Consider statin at discharge.  Underweight Body mass index is 15.22 kg/m. -Nutrition consult  Mobility: PT eval obtained.  SNF recommended. Code Status:   Code Status: Full Code  Nutritional status: Body mass index is 15.22 kg/m. Nutrition Problem: Increased nutrient needs Etiology: acute illness Signs/Symptoms: estimated needs Diet Order            Diet regular Room service appropriate? Yes with Assist; Fluid consistency: Thin  Diet effective now                 DVT prophylaxis: heparin injection 5,000 Units Start: 03/28/20 1500 SCD's Start: 03/28/20 0357   Antimicrobials:  None Fluid: None Consultants: Neurology Family Communication:  None at  bedside  Status is: Inpatient  Remains inpatient appropriate because -pending SNF placement   Dispo: The patient is from: Home              Anticipated d/c is to: SNF              Anticipated d/c date is: Whenever bed is available              Patient currently is medically stable to d/c.    Infusions:     Scheduled Meds: . amLODipine  5 mg Oral Daily  . benazepril  20 mg Oral Daily  . Chlorhexidine Gluconate Cloth  6 each Topical Daily  . feeding supplement  237 mL Oral BID BM  . ferrous sulfate  325 mg Oral BID WC  . heparin injection (subcutaneous)  5,000 Units Subcutaneous Q12H  . insulin aspart  0-5 Units Subcutaneous QHS  . insulin aspart  0-9 Units Subcutaneous TID WC  . metoprolol tartrate  25 mg Oral BID  . pantoprazole sodium  40 mg Oral Daily  . QUEtiapine  25 mg Oral BID  . senna-docusate  1 tablet Oral BID    Antimicrobials: Anti-infectives (From admission, onward)   None      PRN meds: acetaminophen **OR** acetaminophen (TYLENOL) oral liquid 160 mg/5 mL **OR** acetaminophen, haloperidol lactate, labetalol, LORazepam   Objective: Vitals:   04/02/20 0509 04/02/20 0857  BP: 116/67 120/70  Pulse: 81 80  Resp:  18  Temp:  98.4 F (36.9 C)  SpO2:  99%    Intake/Output Summary (Last 24 hours) at 04/02/2020 1112 Last data filed at 04/02/2020 0530 Gross per 24 hour  Intake --  Output 500 ml  Net -500 ml   Filed Weights   03/27/20 1859 03/29/20 0900  Weight: 57.4 kg 50.9 kg   Weight change:  Body mass index is 15.22 kg/m.   Physical Exam: General exam: Not in physical distress.  Comfortable Skin: No rashes, lesions or ulcers. HEENT: Atraumatic, normocephalic, supple neck, no obvious bleeding Lungs: Clear to auscultation bilaterally CVS: Regular rate and rhythm, no murmur GI/Abd soft, nontender, nondistended, bowel sound present CNS: Sleeping, arousable, not in distress. Psychiatry: Mood appropriate Extremities: No pedal edema, no calf tenderness  Data Review: I have personally reviewed the laboratory data and studies available.  Recent Labs  Lab 03/29/20 0047 03/30/20 0337 03/31/20 0318 04/01/20 0246 04/02/20 0601  WBC 6.6 5.7 4.6 4.9 4.4  HGB 10.2* 10.7* 11.2* 11.3* 10.8*  HCT 32.1* 32.7* 34.7* 35.6* 33.8*  MCV 77.2* 76.4* 77.3* 76.7*  77.3*  PLT 195 183 228 204 189   Recent Labs  Lab 03/29/20 0047 03/30/20 0337 03/31/20 0318 04/01/20 0246 04/02/20 0203  NA 136 142 143 141 141  K 3.3* 4.0 3.9 3.3* 5.7*  CL 103 111 110 106 107  CO2 22 20* 24 25 20*  GLUCOSE 93 84 97 121* 94  BUN 17 19 14 11 14   CREATININE 0.94 1.29* 1.33* 1.18 1.25*  CALCIUM 8.7* 9.0 8.9 9.0 9.0    F/u labs ordered  Signed, Terrilee Croak, MD [facility] 04/02/2020

## 2020-04-03 ENCOUNTER — Inpatient Hospital Stay
Admission: RE | Admit: 2020-04-03 | Discharge: 2020-04-09 | Disposition: A | Discharge status: Other Acute Inpatient Hospital | Payer: Medicare Other | Source: Ambulatory Visit | Attending: Internal Medicine | Admitting: Internal Medicine

## 2020-04-03 DIAGNOSIS — D631 Anemia in chronic kidney disease: Secondary | ICD-10-CM | POA: Diagnosis not present

## 2020-04-03 DIAGNOSIS — G9389 Other specified disorders of brain: Secondary | ICD-10-CM | POA: Diagnosis not present

## 2020-04-03 DIAGNOSIS — F801 Expressive language disorder: Secondary | ICD-10-CM | POA: Diagnosis not present

## 2020-04-03 DIAGNOSIS — M6281 Muscle weakness (generalized): Secondary | ICD-10-CM | POA: Diagnosis not present

## 2020-04-03 DIAGNOSIS — Z79899 Other long term (current) drug therapy: Secondary | ICD-10-CM | POA: Diagnosis not present

## 2020-04-03 DIAGNOSIS — F0391 Unspecified dementia with behavioral disturbance: Secondary | ICD-10-CM | POA: Diagnosis not present

## 2020-04-03 DIAGNOSIS — R410 Disorientation, unspecified: Secondary | ICD-10-CM | POA: Diagnosis not present

## 2020-04-03 DIAGNOSIS — I618 Other nontraumatic intracerebral hemorrhage: Secondary | ICD-10-CM | POA: Diagnosis not present

## 2020-04-03 DIAGNOSIS — M2550 Pain in unspecified joint: Secondary | ICD-10-CM | POA: Diagnosis not present

## 2020-04-03 DIAGNOSIS — F0150 Vascular dementia without behavioral disturbance: Secondary | ICD-10-CM | POA: Diagnosis not present

## 2020-04-03 DIAGNOSIS — F0151 Vascular dementia with behavioral disturbance: Secondary | ICD-10-CM | POA: Diagnosis not present

## 2020-04-03 DIAGNOSIS — Z741 Need for assistance with personal care: Secondary | ICD-10-CM | POA: Diagnosis not present

## 2020-04-03 DIAGNOSIS — E1122 Type 2 diabetes mellitus with diabetic chronic kidney disease: Secondary | ICD-10-CM | POA: Diagnosis not present

## 2020-04-03 DIAGNOSIS — G936 Cerebral edema: Secondary | ICD-10-CM | POA: Diagnosis not present

## 2020-04-03 DIAGNOSIS — G9340 Encephalopathy, unspecified: Secondary | ICD-10-CM | POA: Diagnosis not present

## 2020-04-03 DIAGNOSIS — F419 Anxiety disorder, unspecified: Secondary | ICD-10-CM | POA: Diagnosis not present

## 2020-04-03 DIAGNOSIS — I129 Hypertensive chronic kidney disease with stage 1 through stage 4 chronic kidney disease, or unspecified chronic kidney disease: Secondary | ICD-10-CM | POA: Diagnosis not present

## 2020-04-03 DIAGNOSIS — R58 Hemorrhage, not elsewhere classified: Secondary | ICD-10-CM | POA: Diagnosis not present

## 2020-04-03 DIAGNOSIS — I10 Essential (primary) hypertension: Secondary | ICD-10-CM | POA: Diagnosis not present

## 2020-04-03 DIAGNOSIS — N184 Chronic kidney disease, stage 4 (severe): Secondary | ICD-10-CM | POA: Diagnosis not present

## 2020-04-03 DIAGNOSIS — E1159 Type 2 diabetes mellitus with other circulatory complications: Secondary | ICD-10-CM | POA: Diagnosis not present

## 2020-04-03 DIAGNOSIS — E43 Unspecified severe protein-calorie malnutrition: Secondary | ICD-10-CM | POA: Diagnosis not present

## 2020-04-03 DIAGNOSIS — K219 Gastro-esophageal reflux disease without esophagitis: Secondary | ICD-10-CM | POA: Diagnosis not present

## 2020-04-03 DIAGNOSIS — I69351 Hemiplegia and hemiparesis following cerebral infarction affecting right dominant side: Secondary | ICD-10-CM | POA: Diagnosis not present

## 2020-04-03 DIAGNOSIS — R262 Difficulty in walking, not elsewhere classified: Secondary | ICD-10-CM | POA: Diagnosis not present

## 2020-04-03 DIAGNOSIS — E1169 Type 2 diabetes mellitus with other specified complication: Secondary | ICD-10-CM | POA: Diagnosis not present

## 2020-04-03 DIAGNOSIS — I619 Nontraumatic intracerebral hemorrhage, unspecified: Secondary | ICD-10-CM | POA: Diagnosis not present

## 2020-04-03 DIAGNOSIS — Z7401 Bed confinement status: Secondary | ICD-10-CM | POA: Diagnosis not present

## 2020-04-03 DIAGNOSIS — I69220 Aphasia following other nontraumatic intracranial hemorrhage: Secondary | ICD-10-CM | POA: Diagnosis not present

## 2020-04-03 DIAGNOSIS — I616 Nontraumatic intracerebral hemorrhage, multiple localized: Secondary | ICD-10-CM | POA: Diagnosis not present

## 2020-04-03 DIAGNOSIS — R6339 Other feeding difficulties: Secondary | ICD-10-CM | POA: Diagnosis not present

## 2020-04-03 DIAGNOSIS — I611 Nontraumatic intracerebral hemorrhage in hemisphere, cortical: Secondary | ICD-10-CM | POA: Diagnosis not present

## 2020-04-03 DIAGNOSIS — E46 Unspecified protein-calorie malnutrition: Secondary | ICD-10-CM | POA: Diagnosis not present

## 2020-04-03 DIAGNOSIS — R456 Violent behavior: Secondary | ICD-10-CM | POA: Diagnosis present

## 2020-04-03 LAB — RESPIRATORY PANEL BY RT PCR (FLU A&B, COVID)
Influenza A by PCR: NEGATIVE
Influenza B by PCR: NEGATIVE
SARS Coronavirus 2 by RT PCR: NEGATIVE

## 2020-04-03 LAB — GLUCOSE, CAPILLARY
Glucose-Capillary: 95 mg/dL (ref 70–99)
Glucose-Capillary: 130 mg/dL — ABNORMAL HIGH (ref 70–99)

## 2020-04-03 MED ORDER — METOPROLOL TARTRATE 25 MG PO TABS: 25.0000 mg | ORAL_TABLET | Freq: Two times a day (BID) | ORAL | Status: AC

## 2020-04-03 MED ORDER — ENSURE ENLIVE PO LIQD: 237.0000 mL | Freq: Two times a day (BID) | ORAL | 12 refills | Status: AC

## 2020-04-03 MED ORDER — ATORVASTATIN CALCIUM 40 MG PO TABS: 40.0000 mg | ORAL_TABLET | Freq: Every day | ORAL | 11 refills | Status: DC

## 2020-04-03 MED ORDER — SENNOSIDES-DOCUSATE SODIUM 8.6-50 MG PO TABS: 1.0000 | ORAL_TABLET | Freq: Two times a day (BID) | ORAL | Status: AC

## 2020-04-03 MED ORDER — FERROUS SULFATE 325 (65 FE) MG PO TABS
325.0000 mg | ORAL_TABLET | Freq: Two times a day (BID) | ORAL | 3 refills | Status: DC
Start: 2020-04-03 — End: 2020-04-10

## 2020-04-03 MED ORDER — QUETIAPINE FUMARATE 25 MG PO TABS
25.0000 mg | ORAL_TABLET | Freq: Two times a day (BID) | ORAL | Status: DC
Start: 2020-04-03 — End: 2020-04-11

## 2020-04-03 NOTE — TOC Transition Note (Signed)
Transition of Care [facility]) - CM/SW Discharge Note   Patient Details  Name: Ryan Adams MRN: [id number] Date of Birth: October 16, 1927  Transition of [facility] CM/SW Contact:  Bethann Berkshire, [place] Phone Number: 04/03/2020, 11:07 AM   Clinical Narrative:    Pt received  Josem Kaufmann SKA#[id number]. 11/22 - 11/24 Patient will DC to: [facility] Anticipated DC date: 04/03/20 Family notified: Gillian Shields Transport by: Corey Harold   Per MD patient ready for DC to [facility] . RN, patient, patient's family, and facility notified of DC. Discharge Summary and FL2 sent to facility. RN to call report prior to discharge [phone]). DC packet on chart. Ambulance transport requested for patient.   CSW will sign off for now as social work intervention is no longer needed. Please consult [place] again if new needs arise.    Final next level of care: Skilled Nursing Facility Barriers to Discharge: No Barriers Identified   Patient Goals and CMS Choice Patient states their goals for this hospitalization and ongoing recovery are:: patient unable to participate in goal setting due to disorientation CMS Medicare.gov Compare Post Acute Care list provided to:: Patient Represenative (must comment) Choice offered to / list presented to : Adult Children  Discharge Placement              Patient [facility] bed at: [facility] Patient to be transferred to facility by: [place] Name of family member notified: Cordarryl Monrreal Patient and family notified of of transfer: 04/03/20  Discharge Plan and Services     Post Acute Care Choice: [place]                               Social Determinants of Health (SDOH) Interventions     Readmission Risk Interventions No flowsheet data found.

## 2020-04-03 NOTE — Progress Notes (Signed)
Physical Therapy Treatment Patient Details Name: Ryan Adams MRN: [id number] DOB: 15-May-1927 Today's Date: 04/03/2020    History of Present Illness Pt is 86 year old male with past history of diabetes, hypertension, with sudden onset of confusion.  On examination revealed dysarthria, expressive aphasia with no focal motor or sensory deficits. 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      PT Comments    Patient progressing slowly towards PT goals. Already sitting EOB with LEs between rails upon arrival. Pt reluctant to have help throughout session despite being weak and unsafe with mobility. Requires Min-Mod A for steadying upon standing and Mod A for gait training to/from bathroom with forward momentum, anterior trunk lean and bil knee instability. Multiple LOB throughout. Poor awareness of safety/deficits. Pt plans to d/c to SNF today. Will follow.    Follow Up Recommendations  SNF;Supervision/Assistance - 24 hour     Equipment Recommendations  Other (comment) (defer to next venue)    Recommendations for Other Services       Precautions / Restrictions Precautions Precautions: Fall Restrictions Weight Bearing Restrictions: No    Mobility  Bed Mobility               General bed mobility comments: Sitting EOB upon PT arrival with LEs between the rails.  Transfers Overall transfer level: Needs assistance Equipment used: None Transfers: Sit to/from Stand Sit to Stand: Min assist         General transfer comment: Min A to steady in standing, pt stating to get rid of the RW,  Anterior trunk lean. Stood from [employer], from low toilet x1, pt pushing therapist away not wanting any help but needing it.  Ambulation/Gait Ambulation/Gait assistance: Mod assist Gait Distance (Feet): 20 Feet (x2 bouts) Assistive device: 1 person hand held assist;None Gait Pattern/deviations: Step-to pattern;Step-through pattern;Trunk flexed;Shuffle;Narrow base of  support Gait velocity: reduced Gait velocity interpretation: <1.31 ft/sec, indicative of household ambulator General Gait Details: Slow, very unsteady gait with reduced step length bilaterally, forward momentum/trunk lean with multiple LOB in alldirections without constant Mod A. Refusing RW and HHA initially. Bil knee instability throughout.   Stairs             Wheelchair Mobility    Modified Rankin (Stroke Patients Only) Modified Rankin (Stroke Patients Only) Pre-Morbid Rankin Score: No symptoms Modified Rankin: Moderately severe disability     Balance Overall balance assessment: Needs assistance Sitting-balance support: Feet supported;No upper extremity supported Sitting balance-Leahy Scale: Fair Sitting balance - Comments: close Min guard for safety.   Standing balance support: During functional activity Standing balance-Leahy Scale: Poor Standing balance comment: Requires external support for standing. Standing at sink for ~10 minutes washing hands, putting in dentures, washing face wtih flexed trunk and bil knee flexion needing constant min A for support. Difficulty turning on water/finding paper towels.                            Cognition Arousal/Alertness: Awake/alert Behavior During Therapy: WFL for tasks assessed/performed Overall Cognitive Status: Impaired/Different from baseline Area of Impairment: Attention;Problem solving;Memory;Safety/judgement                   Current Attention Level: Sustained Memory: Decreased recall of precautions;Decreased short-term memory Following Commands: Follows one step commands consistently;Follows multi-step commands inconsistently Safety/Judgement: Decreased awareness of deficits;Decreased awareness of safety Awareness: Intellectual Problem Solving: Slow processing;Difficulty sequencing General Comments: poor awareness of deficits/safety.  Exercises      General Comments        Pertinent  Vitals/Pain Pain Assessment: Faces Faces Pain Scale: No hurt    Home Living                      Prior Function            PT Goals (current goals can now be found in the care plan section) Progress towards PT goals: Progressing toward goals    Frequency    Min 3X/week      PT Plan Current plan remains appropriate    Co-evaluation              AM-PAC PT "6 Clicks" Mobility   Outcome Measure  Help needed turning from your back to your side while in a flat bed without using bedrails?: None Help needed moving from lying on your back to sitting on the side of a flat bed without using bedrails?: A Little Help needed moving to and from a bed to a chair (including a wheelchair)?: A Little Help needed standing up from a chair using your arms (e.g., wheelchair or bedside chair)?: A Little Help needed to walk in hospital room?: A Lot Help needed climbing 3-5 steps with a railing? : Total 6 Click Score: 16    End of Session Equipment Utilized During Treatment: Gait belt Activity Tolerance: Patient limited by fatigue;Patient tolerated treatment well Patient left: in bed;with call bell/phone within reach;with bed alarm set Nurse Communication: Mobility status PT Visit Diagnosis: Unsteadiness on feet (R26.81);Other abnormalities of gait and mobility (R26.89);Other symptoms and signs involving the nervous system (R29.898)     Time: 5208-0223 PT Time Calculation (min) (ACUTE ONLY): 21 min  Charges:  $Therapeutic Activity: 8-22 mins                     Marisa Severin, PT, DPT Acute Rehabilitation Services Pager [phone] Office [phone]       Ryan Adams 04/03/2020, 12:59 PM

## 2020-04-03 NOTE — Plan of Care (Signed)

## 2020-04-03 NOTE — Progress Notes (Signed)
  Speech Language Pathology Treatment: Cognitive-Linquistic  Patient Details Name: Ryan Adams MRN: [id number] DOB: 28-Aug-1927 Today's Date: 04/03/2020 Time: 6269-4854 SLP Time Calculation (min) (ACUTE ONLY): 17 min  Assessment / Plan / Recommendation Clinical Impression  Pt seen for skilled linguistic tx with noted impaired cognition as pt frequently attempted getting out of bed despite verbal cueing for safety/use of call bell prn when leaving bed.  Pt became agitated with SLP and "wanted privacy."  Nursing informed of pt's decreased safety awareness/attempts to leave bed and removal of safety mitts.  Pt oriented to self only and not situation, place or date.  Personal information questions with 40% accuracy and 1-2 step directives given mod verbal/visual cues with 50% accuracy this date, although pt was agitated and intently focused on getting out of bed vs continuing tx session.  Naming activity with min participation as pt stated "I don't want to answer your questions."  ST will continue efforts in acute setting for aphasia/cognition tx.    HPI HPI: Pt is 86 year old man with past history of diabetes, hypertension, with sudden onset of confusion.  On examination revealed dysarthria, expressive aphasia with no focal motor or sensory deficits. 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       SLP Plan  Continue with current plan of care       Recommendations   Continue ST while in acute setting; SNF                Follow up Recommendations: Skilled Nursing facility SLP Visit Diagnosis: Aphasia (R47.01);Cognitive communication deficit (R41.841) Plan: Continue with current plan of care                       Elvina Sidle, M.S., CCC-SLP 04/03/2020, 11:04 AM

## 2020-04-03 NOTE — Progress Notes (Signed)
Pt discharged to facility via transportation service.  Discharge packet complete and sent with pt.  VSS, NAD.

## 2020-04-03 NOTE — Discharge Summary (Signed)
Physician Discharge Summary  Ryan Adams SKA:[id number] DOB: [birth year] DOA: 03/27/2020  PCP: Neale Burly, MD  Admit date: 03/27/2020 Discharge date: 04/03/2020  Admitted From: Home Discharge disposition: SNF   Code Status: Full Code  Diet Recommendation: Cardiac diet  Discharge Diagnosis:   Active Problems:   ICH (intracerebral hemorrhage) ([place])   History of Present Illness / Brief narrative:  Ryan Adams is a 87 y.o. male with past medical history significant for diabetes mellitus and hypertension. Patient was brought to ED at [facility] by family on 03/27/2020 for evaluation of confusion. Last known normal 2 days ago.  For the preceding 2 days, patient was not able to answer any questions to the family.  Family also noted some bruising in his left temple but he did not have a witnessed fall.  Up until last Saturday-03/25/2020-living independently, still driving. Brain imaging showed a left posterior temporal ICH.  Patient was transferred to [facility] and admitted under neurology service. Transferred to hospital service on 11/18.  Subjective:  Seen and examined this morning.  Elderly African-American male.  Not in distress.  Sleeping, arousable.  No complaint.  Hospital Course:  Acute intracranial hemorrhage -CT scan of head with left temporal ICH, hypertensive vs CAA -MRI acute hematoma left temporoparietal lobe, unchanged.Chronic microhemorrhages.  -CT angio head and neck unremarkable. -Echo with EF 60 to 65%, -LDL elevated to 171, A1c 6.3. -Neurology advised against antithrombotic because of ICH. -PT eval obtained.  Recommend SNF. -Dysphagia is improving.  Currently on regular diet.  Delirium/acute encephalopathy -Related to [place].    For family, prior to admission, patient was functional but had underlying risk factors for dementia. -In the hospital, he has had episodes of agitation and confusion.  Currently controlled on Seroquel 25 mg twice daily.  Continue  the same post discharge.  AKI -Probably related to poor oral hydration. -Creatinine improved.  Encourage oral hydration. Recent Labs    03/27/20 2105 03/29/20 0047 03/30/20 0337 03/31/20 0318 04/01/20 0246 04/02/20 0203  BUN 22 17 19 14 11 14   CREATININE 1.29* 0.94 1.29* 1.33* 1.18 1.25*   Hypokalemia/hyperkalemia -Potassium level was low.  He was replaced aggressively after which it went up to 5.7.  No further replacement given.   Recent Labs  Lab 03/29/20 0047 03/30/20 0337 03/31/20 0318 04/01/20 0246 04/02/20 0203  K 3.3* 4.0 3.9 3.3* 5.7*   Diabetes mellitus type 2 -A1c 6.3. -Metformin on hold.  Fingerstick blood glucose monitoring have all been normal. -I would not resume Metformin at discharge because of his normal blood sugar trend and borderline elevated creatinine.  Essential hypertension -Home meds include amlodipine, metoprolol and benazepril. -Currently continued on all of them.  Hyperlipidemia -LDL 171, goal less than 70.  Per neurology recommendation, I will start him on Lipitor 40 mg daily at discharge.  Underweight Body mass index is 15.22 kg/m. -Nutrition consult  Stable for discharge to SNF today.   Wound care:    Discharge Exam:   Vitals:   04/03/20 0015 04/03/20 0335 04/03/20 0500 04/03/20 0902  BP: (!) 149/93 (!) 162/68 (!) 159/89 103/89  Pulse: 80  76 65  Resp: 18 (!) 22    Temp: 98.6 F (37 C) (!) 97.4 F (36.3 C)  98.5 F (36.9 C)  TempSrc: Axillary Axillary  Oral  SpO2: 98% 97%  100%  Weight:      Height:        Body mass index is 15.22 kg/m.  General exam: Comfortable.  Not in physical distress Skin: No rashes, lesions or ulcers. HEENT: Atraumatic, normocephalic, no obvious bleeding Lungs: Clear to auscultation bilaterally CVS: Regular rate and rhythm, no murmur GI/Abd soft, nontender, nondistended, bowel sound present CNS: Arousable, not in distress.  Slow to respond but able to follow some commands Psychiatry:  Mood appropriate Extremities: No pedal edema, no calf tenderness  Follow ups:   Discharge Instructions    Ambulatory referral to Neurology   Complete by: As directed    Follow up with stroke clinic NP (Jessica Vanschaick or Cecille Rubin, if both not available, consider Zachery Dauer, or [place]) at [facility] in about 4 weeks. Thanks.   Diet - low sodium heart healthy   Complete by: As directed    Increase activity slowly   Complete by: As directed       Follow-up Information    [facility]. Schedule an appointment as soon as possible for a visit in 4 week(s).   Specialty: Neurology Contact information: [place] [phone]       Neale Burly, MD Follow up.   Specialty: Internal Medicine Contact information: [address] [place] [address] [address]               Recommendations for Outpatient Follow-Up:   1. Follow-up with PCP as an outpatient  Discharge Instructions:  Follow with Primary MD Neale Burly, MD in 7 days   Get CBC/BMP checked in next visit within 1 week by PCP or SNF MD ( we routinely change or add medications that can affect your baseline labs and fluid status, therefore we recommend that you get the mentioned basic workup next visit with your PCP, your PCP may decide not to get them or add new tests based on their clinical decision)  On your next visit with your PCP, please Get Medicines reviewed and adjusted.  Please request your PCP  to go over all Hospital Tests and Procedure/Radiological results at the follow up, please get all Hospital records sent to your Prim MD by signing hospital release before you go home.  Activity: As tolerated with Full fall precautions use walker/cane & assistance as needed  For Heart failure patients - Check your Weight same time everyday, if you gain over 2 pounds, or you develop in leg swelling, experience more shortness of breath or  chest pain, call your Primary MD immediately. Follow Cardiac Low Salt Diet and 1.5 lit/day fluid restriction.  If you have smoked or chewed Tobacco in the last 2 yrs please stop smoking, stop any regular Alcohol  and or any Recreational drug use.  If you experience worsening of your admission symptoms, develop shortness of breath, life threatening emergency, suicidal or homicidal thoughts you must seek medical attention immediately by calling 911 or calling your MD immediately  if symptoms less severe.  You Must read complete instructions/literature along with all the possible adverse reactions/side effects for all the Medicines you take and that have been prescribed to you. Take any new Medicines after you have completely understood and accpet all the possible adverse reactions/side effects.   Do not drive, operate heavy machinery, perform activities at heights, swimming or participation in water activities or provide baby sitting services if your were admitted for syncope or siezures until you have seen by Primary MD or a Neurologist and advised to do so again.  Do not drive when taking Pain medications.  Do not take more than prescribed Pain,  Sleep and Anxiety Medications  Wear Seat belts while driving.   Please note You were cared for by a hospitalist during your hospital stay. If you have any questions about your discharge medications or the care you received while you were in the hospital after you are discharged, you can call the unit and asked to speak with the hospitalist on call if the hospitalist that took care of you is not available. Once you are discharged, your primary care physician will handle any further medical issues. Please note that NO REFILLS for any discharge medications will be authorized once you are discharged, as it is imperative that you return to your primary care physician (or establish a relationship with a primary care physician if you do not have one) for your  aftercare needs so that they can reassess your need for medications and monitor your lab values.    Allergies as of 04/03/2020   Not on File     Medication List    STOP taking these medications   metFORMIN 1000 MG tablet Commonly known as: GLUCOPHAGE     TAKE these medications   amLODipine 5 MG tablet Commonly known as: NORVASC Take 5 mg by mouth daily.   atorvastatin 40 MG tablet Commonly known as: Lipitor Take 1 tablet (40 mg total) by mouth daily.   benazepril 20 MG tablet Commonly known as: LOTENSIN Take 20 mg by mouth daily.   Centrum Silver 50+Men Tabs Take 1 tablet by mouth daily.   feeding supplement Liqd Take 237 mLs by mouth 2 (two) times daily between meals.   ferrous sulfate 325 (65 FE) MG tablet Take 1 tablet (325 mg total) by mouth 2 (two) times daily with a meal.   fluticasone 50 MCG/ACT nasal spray Commonly known as: FLONASE Place into both nostrils.   metoprolol tartrate 25 MG tablet Commonly known as: LOPRESSOR Take 1 tablet (25 mg total) by mouth 2 (two) times daily. What changed:   medication strength  how much to take  when to take this   omeprazole 20 MG capsule Commonly known as: PRILOSEC Take 20 mg by mouth daily.   QUEtiapine 25 MG tablet Commonly known as: SEROQUEL Take 1 tablet (25 mg total) by mouth 2 (two) times daily.   senna-docusate 8.6-50 MG tablet Commonly known as: Senokot-S Take 1 tablet by mouth 2 (two) times daily.   tamsulosin 0.4 MG Caps capsule Commonly known as: FLOMAX Take 0.4 mg by mouth daily.   traZODone 50 MG tablet Commonly known as: DESYREL       Time coordinating discharge: 35 minutes  The results of significant diagnostics from this hospitalization (including imaging, microbiology, ancillary and laboratory) are listed below for reference.    Procedures and Diagnostic Studies:   CT ANGIO HEAD W OR WO CONTRAST  Result Date: 03/28/2020 CLINICAL DATA:  Follow-up parenchymal hemorrhage  EXAM: CT ANGIOGRAPHY HEAD AND NECK TECHNIQUE: Multidetector CT imaging of the head and neck was performed using the standard protocol during bolus administration of intravenous contrast. Multiplanar CT image reconstructions and MIPs were obtained to evaluate the vascular anatomy. Carotid stenosis measurements (when applicable) are obtained utilizing NASCET criteria, using the distal internal carotid diameter as the denominator. CONTRAST:  8mL OMNIPAQUE IOHEXOL 350 MG/ML SOLN COMPARISON:  Head CT from yesterday FINDINGS: CT HEAD FINDINGS Brain: Parenchymal hemorrhage in the posterior left temporal lobe, extending to the occipital lobe, with rim of edema. The shape is unchanged and dimensions are also essentially stable at up to 35  x 18 mm on axial slices. Cerebral volume loss in keeping with age. Vascular: See below Skull: Normal. Negative for fracture or focal lesion. Sinuses: Imaged portions are clear. Orbits: Cataract resection and scleral band on the right. Review of the MIP images confirms the above findings CTA NECK FINDINGS Aortic arch: No acute finding where covered. Three vessel branching. Right carotid system: Mild atheromatous plaque about the bifurcation, especially for age. No stenosis or ulceration. Left carotid system: Mild atheromatous plaque about the bifurcation, especially for age. No stenosis or ulceration. Vertebral arteries: No proximal subclavian or brachiocephalic stenosis. Codominant vertebral arteries which are tortuous but smoothly contoured. No vertebral stenosis. Skeleton: Diffuse degenerative disc disease with disc collapse and endplate erosion. Heterogeneous bony density without discrete lesion, likely osteopenic. A similar appearance was seen on the 2020 abdominal and lower extremity CT. Other neck: No acute finding. Upper chest: No acute finding Review of the MIP images confirms the above findings CTA HEAD FINDINGS Anterior circulation: Atheromatous calcification of the carotid  siphons which is mild. No branch occlusion, beading, or aneurysm. No abnormal enhancement at the level of the hematoma. Posterior circulation: The vertebral and basilar arteries are smooth and widely patent. Incidental proximal basilar fenestration. No branch occlusion, beading, or aneurysm. No abnormal enhancement at the level of the hematoma. Venous sinuses: Diffusely patent Anatomic variants: As above Review of the MIP images confirms the above findings IMPRESSION: 1. No evidence of vascular lesion or mass underlying the patient's parenchymal hemorrhage. The hematoma is nonprogressive from yesterday. 2. Mild for age atherosclerosis. Electronically Signed   By: Monte Fantasia M.D.   On: 03/28/2020 11:19   CT HEAD WO CONTRAST  Result Date: 03/27/2020 CLINICAL DATA:  86 year old male with altered mental status. EXAM: CT HEAD WITHOUT CONTRAST TECHNIQUE: Contiguous axial images were obtained from the base of the skull through the vertex without intravenous contrast. COMPARISON:  None. FINDINGS: Brain: Left posterior temporal lobe intraparenchymal hemorrhage measures approximately 3.4 x 1.8 cm in greatest axial dimensions and 2.3 cm in craniocaudal length. There is mild surrounding edema. There is mild age-related atrophy and chronic microvascular ischemic changes. There is no midline shift. No extra-axial fluid collection. Vascular: No hyperdense vessel or unexpected calcification. Skull: Normal. Negative for fracture or focal lesion. Sinuses/Orbits: No acute finding. Other: None IMPRESSION: 1. 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 Further evaluation with MRI recommended. No midline shift. 2. Mild chronic microvascular ischemic changes. These results were called by telephone at the time of interpretation on 03/27/2020 at 7:34 pm to provider Carmin Muskrat , who verbally acknowledged these results. Electronically Signed   By: Anner Crete  M.D.   On: 03/27/2020 19:37   CT ANGIO NECK W OR WO CONTRAST  Result Date: 03/28/2020 CLINICAL DATA:  Follow-up parenchymal hemorrhage EXAM: CT ANGIOGRAPHY HEAD AND NECK TECHNIQUE: Multidetector CT imaging of the head and neck was performed using the standard protocol during bolus administration of intravenous contrast. Multiplanar CT image reconstructions and MIPs were obtained to evaluate the vascular anatomy. Carotid stenosis measurements (when applicable) are obtained utilizing NASCET criteria, using the distal internal carotid diameter as the denominator. CONTRAST:  7mL OMNIPAQUE IOHEXOL 350 MG/ML SOLN COMPARISON:  Head CT from yesterday FINDINGS: CT HEAD FINDINGS Brain: Parenchymal hemorrhage in the posterior left temporal lobe, extending to the occipital lobe, with rim of edema. The shape is unchanged and dimensions are also essentially stable at up to 35 x 18 mm on axial slices. Cerebral volume loss in  keeping with age. Vascular: See below Skull: Normal. Negative for fracture or focal lesion. Sinuses: Imaged portions are clear. Orbits: Cataract resection and scleral band on the right. Review of the MIP images confirms the above findings CTA NECK FINDINGS Aortic arch: No acute finding where covered. Three vessel branching. Right carotid system: Mild atheromatous plaque about the bifurcation, especially for age. No stenosis or ulceration. Left carotid system: Mild atheromatous plaque about the bifurcation, especially for age. No stenosis or ulceration. Vertebral arteries: No proximal subclavian or brachiocephalic stenosis. Codominant vertebral arteries which are tortuous but smoothly contoured. No vertebral stenosis. Skeleton: Diffuse degenerative disc disease with disc collapse and endplate erosion. Heterogeneous bony density without discrete lesion, likely osteopenic. A similar appearance was seen on the 2020 abdominal and lower extremity CT. Other neck: No acute finding. Upper chest: No acute finding  Review of the MIP images confirms the above findings CTA HEAD FINDINGS Anterior circulation: Atheromatous calcification of the carotid siphons which is mild. No branch occlusion, beading, or aneurysm. No abnormal enhancement at the level of the hematoma. Posterior circulation: The vertebral and basilar arteries are smooth and widely patent. Incidental proximal basilar fenestration. No branch occlusion, beading, or aneurysm. No abnormal enhancement at the level of the hematoma. Venous sinuses: Diffusely patent Anatomic variants: As above Review of the MIP images confirms the above findings IMPRESSION: 1. No evidence of vascular lesion or mass underlying the patient's parenchymal hemorrhage. The hematoma is nonprogressive from yesterday. 2. Mild for age atherosclerosis. Electronically Signed   By: Monte Fantasia M.D.   On: 03/28/2020 11:19   MR BRAIN W WO CONTRAST  Result Date: 03/28/2020 CLINICAL DATA:  Cerebral parenchymal hemorrhage follow-up. EXAM: MRI HEAD WITHOUT AND WITH CONTRAST TECHNIQUE: Multiplanar, multiecho pulse sequences of the brain and surrounding structures were obtained without and with intravenous contrast. CONTRAST:  76mL GADAVIST GADOBUTROL 1 MMOL/ML IV SOLN COMPARISON:  CT head 03/27/2020 FINDINGS: Brain: Acute hematoma left temporoparietal lobe unchanged in size measuring approximately 19 x 40 mm. Moderate surrounding edema. Slight peripheral enhancement postcontrast infusion. No evidence of underlying mass lesion. Several additional areas of chronic microhemorrhage in both cerebral hemispheres. Approximately 5 additional areas of chronic microhemorrhage. Mild atrophy. Negative for acute ischemic infarct. Mild white matter changes. No mass lesion identified. Vascular: Normal arterial flow voids. Skull and upper cervical spine: No focal skeletal lesion. Sinuses/Orbits: Paranasal sinuses clear. Ocular surgery on the right. No orbital mass. Other: None IMPRESSION: Acute hematoma left  temporoparietal lobe unchanged with surrounding edema. 5 additional foci of chronic microhemorrhage in the brain. 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 No acute ischemic infarct. Mild chronic microvascular ischemic changes in the white matter. Electronically Signed   By: Franchot Gallo M.D.   On: 03/28/2020 13:25   DG Chest Port 1 View  Result Date: 03/27/2020 CLINICAL DATA:  Change in mental status EXAM: PORTABLE CHEST 1 VIEW COMPARISON:  November 10, 2008 FINDINGS: The heart size and mediastinal contours are within normal limits. Aortic knob calcifications are seen. Both lungs are clear. The visualized skeletal structures are unremarkable. IMPRESSION: No active disease. Electronically Signed   By: Prudencio Pair M.D.   On: 03/27/2020 19:25   ECHOCARDIOGRAM COMPLETE  Result Date: 03/28/2020    ECHOCARDIOGRAM REPORT   Patient Name:   Ryan Adams Date of Exam: 03/28/2020 Medical Rec #:  [id number]      Height:       72.0 in Accession #:    [id number]     Weight:  126.5 lb Date of Birth:  November 13, 1927       BSA:          1.754 m Patient Age:    35 years       BP:           119/87 mmHg Patient Gender: M              HR:           84 bpm. Exam Location:  Inpatient Procedure: 2D Echo, Cardiac Doppler and Color Doppler Indications:    Stroke 434.91 / I163.9  History:        Patient has no prior history of Echocardiogram examinations.  Sonographer:    Vickie Epley RDCS Referring Phys: [id number] ASHISH ARORA  Sonographer Comments: Technically difficult study due to poor echo windows. Image acquisition challenging due to patient body habitus. IMPRESSIONS  1. Left ventricular ejection fraction, by estimation, is 60 to 65%. The left ventricle has normal function. Left ventricular endocardial border not optimally defined to evaluate regional wall motion. There is moderate asymmetric left ventricular hypertrophy of the basal-septal segment. Left ventricular diastolic parameters are indeterminate.  2. Right  ventricular systolic function is normal. The right ventricular size is normal. There is normal pulmonary artery systolic pressure. The estimated right ventricular systolic pressure is 50.0 mmHg.  3. The mitral valve is normal in structure. Trivial mitral valve regurgitation. No evidence of mitral stenosis. Moderate mitral annular calcification.  4. The aortic valve is tricuspid. Aortic valve regurgitation is trivial. No aortic stenosis is present by visual assessment, Doppler alignment is suboptimal.  5. Aortic dilatation noted. There is mild dilatation of the aortic root, measuring 42 mm.  6. The inferior vena cava is normal in size with greater than 50% respiratory variability, suggesting right atrial pressure of 3 mmHg. Conclusion(s)/Recommendation(s): No intracardiac source of embolism detected on this transthoracic study. A transesophageal echocardiogram is recommended to exclude cardiac source of embolism if clinically indicated. FINDINGS  Left Ventricle: Left ventricular ejection fraction, by estimation, is 60 to 65%. The left ventricle has normal function. Left ventricular endocardial border not optimally defined to evaluate regional wall motion. The left ventricular internal cavity size was normal in size. There is moderate asymmetric left ventricular hypertrophy of the basal-septal segment. Left ventricular diastolic parameters are indeterminate. Right Ventricle: The right ventricular size is normal. No increase in right ventricular wall thickness. Right ventricular systolic function is normal. There is normal pulmonary artery systolic pressure. The tricuspid regurgitant velocity is 2.58 m/s, and  with an assumed right atrial pressure of 3 mmHg, the estimated right ventricular systolic pressure is 93.8 mmHg. Left Atrium: Left atrial size was not well visualized. Right Atrium: Right atrial size was not well visualized. Pericardium: Trivial pericardial effusion is present. Mitral Valve: The mitral valve is  normal in structure. Moderate mitral annular calcification. Trivial mitral valve regurgitation. No evidence of mitral valve stenosis. Tricuspid Valve: The tricuspid valve is normal in structure. Tricuspid valve regurgitation is mild . No evidence of tricuspid stenosis. Aortic Valve: The aortic valve is tricuspid. Aortic valve regurgitation is trivial. No aortic stenosis is present. Pulmonic Valve: The pulmonic valve was not well visualized. Pulmonic valve regurgitation is not visualized. No evidence of pulmonic stenosis. Aorta: Aortic dilatation noted and the ascending aorta was not well visualized. There is mild dilatation of the aortic root, measuring 42 mm. Venous: The inferior vena cava is normal in size with greater than 50% respiratory variability, suggesting right atrial pressure of  3 mmHg. IAS/Shunts: The interatrial septum was not well visualized.  LEFT VENTRICLE PLAX 2D LVIDd:         4.80 cm  Diastology LVIDs:         3.50 cm  LV e' medial:    4.29 cm/s LV PW:         0.80 cm  LV E/e' medial:  14.8 LV IVS:        0.80 cm  LV e' lateral:   5.87 cm/s LVOT diam:     2.10 cm  LV E/e' lateral: 10.8 LV SV:         42 LV SV Index:   24 LVOT Area:     3.46 cm  RIGHT VENTRICLE RV S prime:     23.30 cm/s TAPSE (M-mode): 2.4 cm LEFT ATRIUM           Index       RIGHT ATRIUM           Index LA diam:      4.40 cm 2.51 cm/m  RA Area:     14.40 cm LA Vol (A4C): 33.1 ml 18.87 ml/m RA Volume:   35.80 ml  20.41 ml/m  AORTIC VALVE LVOT Vmax:   54.40 cm/s LVOT Vmean:  38.800 cm/s LVOT VTI:    0.121 m  AORTA Ao Root diam: 4.20 cm MITRAL VALVE               TRICUSPID VALVE MV Area (PHT): 3.37 cm    TR Peak grad:   26.6 mmHg MV Decel Time: 225 msec    TR Vmax:        258.00 cm/s MV E velocity: 63.30 cm/s MV A velocity: 77.10 cm/s  SHUNTS MV E/A ratio:  0.82        Systemic VTI:  0.12 m                            Systemic Diam: 2.10 cm Cherlynn Kaiser MD Electronically signed by Cherlynn Kaiser MD Signature Date/Time:  03/28/2020/9:50:42 AM    Final      Labs:   Basic Metabolic Panel: Recent Labs  Lab 03/29/20 0047 03/29/20 0047 03/30/20 0981 03/30/20 1914 03/31/20 0318 03/31/20 0318 04/01/20 0246 04/02/20 0203  NA 136  --  142  --  143  --  141 141  K 3.3*   < > 4.0   < > 3.9   < > 3.3* 5.7*  CL 103  --  111  --  110  --  106 107  CO2 22  --  20*  --  24  --  25 20*  GLUCOSE 93  --  84  --  97  --  121* 94  BUN 17  --  19  --  14  --  11 14  CREATININE 0.94  --  1.29*  --  1.33*  --  1.18 1.25*  CALCIUM 8.7*  --  9.0  --  8.9  --  9.0 9.0   < > = values in this interval not displayed.   GFR Estimated Creatinine Clearance: 28.3 mL/min (A) (by C-G formula based on SCr of 1.25 mg/dL (H)). Liver Function Tests: Recent Labs  Lab 03/27/20 2105  AST 31  ALT 16  ALKPHOS 46  BILITOT 0.8  PROT 6.3*  ALBUMIN 3.9   No results for input(s): LIPASE, AMYLASE in the last 168 hours. No results for  input(s): AMMONIA in the last 168 hours. Coagulation profile Recent Labs  Lab 03/27/20 2105  INR 1.1    CBC: Recent Labs  Lab 03/29/20 0047 03/30/20 0337 03/31/20 0318 04/01/20 0246 04/02/20 0601  WBC 6.6 5.7 4.6 4.9 4.4  HGB 10.2* 10.7* 11.2* 11.3* 10.8*  HCT 32.1* 32.7* 34.7* 35.6* 33.8*  MCV 77.2* 76.4* 77.3* 76.7* 77.3*  PLT 195 183 228 204 189   Cardiac Enzymes: No results for input(s): CKTOTAL, CKMB, CKMBINDEX, TROPONINI in the last 168 hours. BNP: Invalid input(s): POCBNP CBG: Recent Labs  Lab 04/02/20 0601 04/02/20 1153 04/02/20 1859 04/02/20 2126 04/03/20 0620  GLUCAP 86 81 94 121* 95   D-Dimer No results for input(s): DDIMER in the last 72 hours. Hgb A1c No results for input(s): HGBA1C in the last 72 hours. Lipid Profile No results for input(s): CHOL, HDL, LDLCALC, TRIG, CHOLHDL, LDLDIRECT in the last 72 hours. Thyroid function studies No results for input(s): TSH, T4TOTAL, T3FREE, THYROIDAB in the last 72 hours.  Invalid input(s): FREET3 Anemia work up No  results for input(s): VITAMINB12, FOLATE, FERRITIN, TIBC, IRON, RETICCTPCT in the last 72 hours. Microbiology Recent Results (from the past 240 hour(s))  Resp Panel by RT PCR (RSV, Flu A&B, Covid) - Nasopharyngeal Swab     Status: None   Collection Time: 03/27/20  7:06 PM   Specimen: Nasopharyngeal Swab  Result Value Ref Range Status   SARS Coronavirus 2 by RT PCR NEGATIVE NEGATIVE Final    Comment: (NOTE) SARS-CoV-2 target nucleic acids are NOT DETECTED.  The SARS-CoV-2 RNA is generally detectable in upper respiratoy specimens during the acute phase of infection. The lowest concentration of SARS-CoV-2 viral copies this assay can detect is 131 copies/mL. A negative result does not preclude SARS-Cov-2 infection and should not be used as the sole basis for treatment or other patient management decisions. A negative result may occur with  improper specimen collection/handling, submission of specimen other than nasopharyngeal swab, presence of viral mutation(s) within the areas targeted by this assay, and inadequate number of viral copies (<131 copies/mL). A negative result must be combined with clinical observations, patient history, and epidemiological information. The expected result is Negative.  Fact Sheet for Patients:  PinkCheek.be  Fact Sheet for Healthcare Providers:  GravelBags.it  This test is no t yet approved or cleared by the [place] FDA and  has been authorized for detection and/or diagnosis of SARS-CoV-2 by FDA under an Emergency Use Authorization (EUA). This EUA will remain  in effect (meaning this test can be used) for the duration of the COVID-19 declaration under Section 564(b)(1) of the Act, 21 U.S.C. section 360bbb-3(b)(1), unless the authorization is terminated or revoked sooner.     Influenza A by PCR NEGATIVE NEGATIVE Final   Influenza B by PCR NEGATIVE NEGATIVE Final    Comment: (NOTE) The  Xpert Xpress SARS-CoV-2/FLU/RSV assay is intended as an aid in  the diagnosis of influenza from Nasopharyngeal swab specimens and  should not be used as a sole basis for treatment. Nasal washings and  aspirates are unacceptable for Xpert Xpress SARS-CoV-2/FLU/RSV  testing.  Fact Sheet for Patients: PinkCheek.be  Fact Sheet for Healthcare Providers: GravelBags.it  This test is not yet approved or cleared by the [place] FDA and  has been authorized for detection and/or diagnosis of SARS-CoV-2 by  FDA under an Emergency Use Authorization (EUA). This EUA will remain  in effect (meaning this test can be used) for the duration of the  Covid-19 declaration  under Section 564(b)(1) of the Act, 21  U.S.C. section 360bbb-3(b)(1), unless the authorization is  terminated or revoked.    Respiratory Syncytial Virus by PCR NEGATIVE NEGATIVE Final    Comment: (NOTE) Fact Sheet for Patients: PinkCheek.be  Fact Sheet for Healthcare Providers: GravelBags.it  This test is not yet approved or cleared by the [place] FDA and  has been authorized for detection and/or diagnosis of SARS-CoV-2 by  FDA under an Emergency Use Authorization (EUA). This EUA will remain  in effect (meaning this test can be used) for the duration of the  COVID-19 declaration under Section 564(b)(1) of the Act, 21 U.S.C.  section 360bbb-3(b)(1), unless the authorization is terminated or  revoked. Performed at [facility], [address] [place], [place] [address]   MRSA PCR Screening     Status: None   Collection Time: 03/28/20  4:30 AM   Specimen: Nasopharyngeal  Result Value Ref Range Status   MRSA by PCR NEGATIVE NEGATIVE Final    Comment:        The GeneXpert MRSA Assay (FDA approved for NASAL specimens only), is one component of a comprehensive MRSA colonization surveillance program. It is  not intended to diagnose MRSA infection nor to guide or monitor treatment for MRSA infections. Performed at [facility], [place] [address], [place], [place] [address]   Respiratory Panel by RT PCR (Flu A&B, Covid) - Nasopharyngeal Swab     Status: None   Collection Time: 04/03/20  1:30 AM   Specimen: Nasopharyngeal Swab; Nasopharyngeal(NP) swabs in vial transport medium  Result Value Ref Range Status   SARS Coronavirus 2 by RT PCR NEGATIVE NEGATIVE Final    Comment: (NOTE) SARS-CoV-2 target nucleic acids are NOT DETECTED.  The SARS-CoV-2 RNA is generally detectable in upper respiratoy specimens during the acute phase of infection. The lowest concentration of SARS-CoV-2 viral copies this assay can detect is 131 copies/mL. A negative result does not preclude SARS-Cov-2 infection and should not be used as the sole basis for treatment or other patient management decisions. A negative result may occur with  improper specimen collection/handling, submission of specimen other than nasopharyngeal swab, presence of viral mutation(s) within the areas targeted by this assay, and inadequate number of viral copies (<131 copies/mL). A negative result must be combined with clinical observations, patient history, and epidemiological information. The expected result is Negative.  Fact Sheet for Patients:  PinkCheek.be  Fact Sheet for Healthcare Providers:  GravelBags.it  This test is no t yet approved or cleared by the [place] FDA and  has been authorized for detection and/or diagnosis of SARS-CoV-2 by FDA under an Emergency Use Authorization (EUA). This EUA will remain  in effect (meaning this test can be used) for the duration of the COVID-19 declaration under Section 564(b)(1) of the Act, 21 U.S.C. section 360bbb-3(b)(1), unless the authorization is terminated or revoked sooner.     Influenza A by PCR NEGATIVE NEGATIVE  Final   Influenza B by PCR NEGATIVE NEGATIVE Final    Comment: (NOTE) The Xpert Xpress SARS-CoV-2/FLU/RSV assay is intended as an aid in  the diagnosis of influenza from Nasopharyngeal swab specimens and  should not be used as a sole basis for treatment. Nasal washings and  aspirates are unacceptable for Xpert Xpress SARS-CoV-2/FLU/RSV  testing.  Fact Sheet for Patients: PinkCheek.be  Fact Sheet for Healthcare Providers: GravelBags.it  This test is not yet approved or cleared by the [place] FDA and  has been authorized for detection  and/or diagnosis of SARS-CoV-2 by  FDA under an Emergency Use Authorization (EUA). This EUA will remain  in effect (meaning this test can be used) for the duration of the  Covid-19 declaration under Section 564(b)(1) of the Act, 21  U.S.C. section 360bbb-3(b)(1), unless the authorization is  terminated or revoked. Performed at [facility], [place] [address] [place],  [address]      Signed: Terrilee Croak  [facility] 04/03/2020, 10:32 AM

## 2020-04-04 ENCOUNTER — Encounter: Payer: Self-pay | Admitting: Adult Health

## 2020-04-04 ENCOUNTER — Non-Acute Institutional Stay (SKILLED_NURSING_FACILITY): Payer: Medicare Other | Admitting: Adult Health

## 2020-04-04 DIAGNOSIS — E46 Unspecified protein-calorie malnutrition: Secondary | ICD-10-CM | POA: Diagnosis not present

## 2020-04-04 DIAGNOSIS — E785 Hyperlipidemia, unspecified: Secondary | ICD-10-CM

## 2020-04-04 DIAGNOSIS — R410 Disorientation, unspecified: Secondary | ICD-10-CM | POA: Diagnosis not present

## 2020-04-04 DIAGNOSIS — E1169 Type 2 diabetes mellitus with other specified complication: Secondary | ICD-10-CM | POA: Insufficient documentation

## 2020-04-04 DIAGNOSIS — K219 Gastro-esophageal reflux disease without esophagitis: Secondary | ICD-10-CM | POA: Insufficient documentation

## 2020-04-04 DIAGNOSIS — K5909 Other constipation: Secondary | ICD-10-CM | POA: Insufficient documentation

## 2020-04-04 DIAGNOSIS — I611 Nontraumatic intracerebral hemorrhage in hemisphere, cortical: Secondary | ICD-10-CM | POA: Diagnosis not present

## 2020-04-04 DIAGNOSIS — G9340 Encephalopathy, unspecified: Secondary | ICD-10-CM

## 2020-04-04 DIAGNOSIS — N184 Chronic kidney disease, stage 4 (severe): Secondary | ICD-10-CM | POA: Diagnosis not present

## 2020-04-04 DIAGNOSIS — D631 Anemia in chronic kidney disease: Secondary | ICD-10-CM

## 2020-04-04 DIAGNOSIS — E875 Hyperkalemia: Secondary | ICD-10-CM

## 2020-04-04 DIAGNOSIS — F5104 Psychophysiologic insomnia: Secondary | ICD-10-CM | POA: Insufficient documentation

## 2020-04-04 DIAGNOSIS — N400 Enlarged prostate without lower urinary tract symptoms: Secondary | ICD-10-CM

## 2020-04-04 DIAGNOSIS — I129 Hypertensive chronic kidney disease with stage 1 through stage 4 chronic kidney disease, or unspecified chronic kidney disease: Secondary | ICD-10-CM

## 2020-04-04 DIAGNOSIS — J3089 Other allergic rhinitis: Secondary | ICD-10-CM | POA: Insufficient documentation

## 2020-04-04 DIAGNOSIS — E1122 Type 2 diabetes mellitus with diabetic chronic kidney disease: Secondary | ICD-10-CM | POA: Insufficient documentation

## 2020-04-04 NOTE — Progress Notes (Signed)
Location:    [place] Room Number: 144-P Place of Service:  SNF (31)   CODE STATUS: Full Code  No Known Allergies  Chief Complaint  Patient presents with  . Hospitalization Follow-up    Follow up from hospital visit dated 03/27/2020-04/03/2020    HPI:  He is a 86 year old man who has been hospitalized from 03-27-20 through 04-03-20. He had been living independently and driving prior to his hospitalization. He was taken to the ED for 2 days of confusion. He was found to have an acute ICH left temporal hematoma and has chronic micro-hemorrhages. He developed acute encephalopathy with acute delirium. He is currently on seroquel 25 mg twice daily. He is here for short term rehab with his goal to go home; however; I doubt that going home will an option for him. There are no reports of agitation; no repots of pain; no reports of heart burn. 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 Aphasia following other nontraumatic intracranial hemorrhage    Per [facility]'s EMR system- [facility]   . Expressive language disorder    Per [facility]'s EMR system- [facility]   . Hemiparesis affecting right side as late effect of cerebrovascular accident [facility])    Per [facility]'s EMR system- [facility]   . Hypertension, essential    Per [facility]'s EMR system- [facility]   . Nontraumatic intracerebral hemorrhage (HCC)    Per [facility]'s EMR system- [facility]   . Type 2 diabetes mellitus with other circulatory complications (HCC)    Per [facility]'s EMR system- [facility]     Past Surgical History:  Procedure Laterality Date  . cranial surgery     with  removal of left earlobe     Social History   Socioeconomic History  . Marital status: Married    Spouse name: Not on file  . Number of children: Not on file  . Years of education: Not on file  . Highest education level: Not on file  Occupational History  . Not on file  Tobacco Use  . Smoking status: Never Smoker  . Smokeless tobacco: Never Used  Vaping Use  . Vaping Use: Never used  Substance and Sexual Activity  . Alcohol use: Not Currently  . Drug use: Never  . Sexual activity: Not Currently  Other Topics Concern  . Not on file  Social History Narrative  . Not on file   Social Determinants of Health   Financial Resource Strain:   . Difficulty of Paying Living Expenses: Not on file  Food Insecurity:   . Worried About Charity fundraiser in the Last Year: Not on file  . Ran Out of Food in the Last Year: Not on file  Transportation Needs:   . Lack of Transportation (Medical): Not on file  . Lack of Transportation (Non-Medical): Not on file  Physical Activity:   . Days of Exercise per Week: Not on file  . Minutes of Exercise per Session: Not on file  Stress:   . Feeling of Stress : Not on file  Social Connections:   . Frequency of Communication with Friends and Family: Not on file  .  Frequency of Social Gatherings with Friends and Family: Not on file  . Attends Religious Services: Not on file  . Active Member of Clubs or Organizations: Not on file  . Attends Archivist Meetings: Not on file  . Marital Status: Not on file  Intimate Partner Violence:   . Fear of Current or Ex-Partner: Not on file  . Emotionally Abused: Not on file  . Physically Abused: Not on file  . Sexually Abused: Not on file   Family History  Problem Relation Age of Onset  . Diabetes Mother   . Hypertension Father       VITAL SIGNS BP (!) 178/86   Pulse 93   Temp (!) 97.2 F (36.2 C)   Resp 20   Ht 6' (1.829 m)   Wt 112 lb (50.8 kg)   SpO2 100%   BMI 15.19 kg/m    Outpatient Encounter Medications as of 04/04/2020  Medication Sig  . amLODipine (NORVASC) 5 MG tablet Take 5 mg by mouth daily.  Marland Kitchen atorvastatin (LIPITOR) 40 MG tablet Take 1 tablet (40 mg total) by mouth daily.  . benazepril (LOTENSIN) 20 MG tablet Take 20 mg by mouth daily.  . feeding supplement (ENSURE ENLIVE / ENSURE PLUS) LIQD Take 237 mLs by mouth 2 (two) times daily between meals.  . ferrous sulfate 325 (65 FE) MG tablet Take 1 tablet (325 mg total) by mouth 2 (two) times daily with a meal.  . fluticasone (FLONASE) 50 MCG/ACT nasal spray Place into both nostrils.  . metoprolol tartrate (LOPRESSOR) 25 MG tablet Take 1 tablet (25 mg total) by mouth 2 (two) times daily.  . Multiple Vitamins-Minerals (CENTRUM SILVER 50+MEN) TABS Take 1 tablet by mouth daily.  Marland Kitchen omeprazole (PRILOSEC) 20 MG capsule Take 20 mg by mouth daily.  . QUEtiapine (SEROQUEL) 25 MG tablet Take 1 tablet (25 mg total) by mouth 2 (two) times daily.  Marland Kitchen senna-docusate (SENOKOT-S) 8.6-50 MG tablet Take 1 tablet by mouth 2 (two) times daily.  . tamsulosin (FLOMAX) 0.4 MG CAPS capsule Take 0.4 mg by mouth daily.  . traZODone (DESYREL) 50 MG tablet Take 50 mg by mouth daily.   Marland Kitchen UNABLE TO FIND Diet:: NAS   No facility-administered encounter medications on file as of 04/04/2020.     SIGNIFICANT DIAGNOSTIC EXAMS  TODAY  03-27-20: chest x-ray:  The heart size and mediastinal contours are within normal limits. Aortic knob calcifications are seen. Both lungs are clear. The visualized skeletal structures are unremarkable.  03-27-20: ct of head:  1. 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 Further evaluation with MRI recommended. No midline shift. 2. Mild chronic microvascular ischemic changes.  03-28-20: 2-d echo:  Left ventricular ejection fraction, by estimation, is 60 to 65%. The  left ventricle has normal function. Left ventricular endocardial border   not optimally defined to evaluate regional wall motion. There is moderate  asymmetric left ventricular hypertrophy of the basal-septal segment. Left ventricular diastolic  parameters are indeterminate.   03-28-20: ct angio of head and neck:  1. No evidence of vascular lesion or mass underlying the patient's parenchymal hemorrhage. The hematoma is nonprogressive from yesterday. 2. Mild for age atherosclerosis.  03-28-20: mri of brain:  Acute hematoma left temporoparietal lobe unchanged with surrounding edema. 5 additional foci of chronic microhemorrhage in the brain. 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 No acute ischemic infarct. Mild chronic microvascular ischemic changes in the white matter.  03-31-20: ct of head:  1.  No significant interval change in size of left posterior temporal intraparenchymal hemorrhage, estimated volume 11 cc. Mildly increased surrounding vasogenic edema without significant regional mass effect or midline shift. No interval subdural or intraventricular extension. 2. No other new acute intracranial abnormality.   LABS REVIEWED TODAY  03-27-20: wbc 6.7; hgb 10.3; hct 32.5; mcv 78.1 plt 190; glucose 104; bun 22; creat 1.29; k+ 3.9; na++ 132; ca 8.9 liver normal albumin 3.9  03-28-20: hgb a1c 6.3; chol 250; ldl 171; trig 108; hdl 57 03-29-20: iron 79; tibc 318; ferritin 35 03-31-20: wbc 4.6; hgb 11.2; hct 34.7; mcv 77.3 plt 228; glucose 97; bun 14; creat 1.33; k+ 3.9; na++ 143; ca 8.9 04-02-20: wbc 4.4; hgb 10.8; hct 33.8; mcv 77.3 plt 189 glucose 94; bun 14; creat 1.25; k+ 5.7; na++ 141; ca 9.0    Review of Systems  Unable to perform ROS: Other (confusion; delirium )     Physical Exam Constitutional:      General: He is not in acute distress.    Appearance: He is underweight. He is not diaphoretic.  HENT:     Ears:     Comments: History of cranial surgery with left ear removal  Neck:     Thyroid: No thyromegaly.  Cardiovascular:     Rate  and Rhythm: Normal rate and regular rhythm.     Pulses: Normal pulses.     Heart sounds: Normal heart sounds.  Pulmonary:     Effort: Pulmonary effort is normal. No respiratory distress.     Breath sounds: Normal breath sounds.  Abdominal:     General: Bowel sounds are normal. There is no distension.     Palpations: Abdomen is soft.     Tenderness: There is no abdominal tenderness.  Musculoskeletal:        General: Normal range of motion.     Cervical back: Neck supple.     Right lower leg: No edema.     Left lower leg: No edema.     Comments: Has mild right side weakness   Lymphadenopathy:     Cervical: No cervical adenopathy.  Skin:    General: Skin is warm and dry.  Neurological:     Mental Status: He is alert. Mental status is at baseline.  Psychiatric:        Mood and Affect: Mood normal.        ASSESSMENT/ PLAN:  TODAY  1. 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 Has some mild right side weakness. Will continue therapy as directed will follow up with neurology as directed. Will continue to monitor his status.   2. Acute delirium/acute encephalopathy: is alert to self only. Is presently on seroquel 25 mg twice daily for agitation.   3. Anemia due to stage IV chronic kidney disease: is stable hgb 10.8 will reduce iron to one time daily will monitor   4. Protein calorie malnutrition unspecified severity: is stable BMI 15.19; albumin 3.9; will continue supplements as directed.   5. Hyperlipidemia associated with type 2 diabetes: is stable LDL 171 will continue lipitor 40 mg daily  6. Type 2 diabetes mellitus with stage 4 chronic kidney disease and hypertension: is stable hgb a1c 6.3 metformin was stopped in the hospital due to his renal failure. Is on ace and statin  7. Hypertension associated with stage 4 chronic kidney disease due to type 2 diabetes mellitus: blood pressure is 178/86 will have nursing monitor blood will  continue norvasc 5 mg  daily lotensin 20 mg daily lopressor 25 mg twice daily; may need to make medication changes in the near future.   8. Chronic non-seasonal allergic rhinitis: is stable will continue flonase daily   9. GERD without esophagitis: is stable will continue prilosec 20 mg daily  10. BPH without urinary obstruction: is stable will continue flomax 0.4 mg daily   11. Chronic constipation: is stable will continue senna s twice daily   12. Chronic insomnia: is stable will continue trazodone 50 mg nightly   13. Hyperkalemia: k+ is 5.7 will repeat labs will monitor        MD is aware of resident's narcotic use and is in agreement with current plan of care. We will attempt to wean resident as appropriate.  Ok Edwards NP [facility]  Contact [phone] Monday through Friday 8am- 5pm  After hours call [phone]

## 2020-04-05 ENCOUNTER — Encounter: Payer: Self-pay | Admitting: Internal Medicine

## 2020-04-05 ENCOUNTER — Non-Acute Institutional Stay (SKILLED_NURSING_FACILITY): Payer: Medicare Other | Admitting: Internal Medicine

## 2020-04-05 ENCOUNTER — Other Ambulatory Visit (HOSPITAL_COMMUNITY)
Admission: RE | Admit: 2020-04-05 | Discharge: 2020-04-05 | Disposition: A | Discharge status: Home / Self Care | Payer: Medicare Other | Source: Skilled Nursing Facility | Attending: Adult Health | Admitting: Adult Health

## 2020-04-05 DIAGNOSIS — F0150 Vascular dementia without behavioral disturbance: Secondary | ICD-10-CM | POA: Insufficient documentation

## 2020-04-05 DIAGNOSIS — E1169 Type 2 diabetes mellitus with other specified complication: Secondary | ICD-10-CM

## 2020-04-05 DIAGNOSIS — I129 Hypertensive chronic kidney disease with stage 1 through stage 4 chronic kidney disease, or unspecified chronic kidney disease: Secondary | ICD-10-CM

## 2020-04-05 DIAGNOSIS — E785 Hyperlipidemia, unspecified: Secondary | ICD-10-CM

## 2020-04-05 DIAGNOSIS — E1122 Type 2 diabetes mellitus with diabetic chronic kidney disease: Secondary | ICD-10-CM | POA: Diagnosis not present

## 2020-04-05 DIAGNOSIS — I611 Nontraumatic intracerebral hemorrhage in hemisphere, cortical: Secondary | ICD-10-CM

## 2020-04-05 DIAGNOSIS — I10 Essential (primary) hypertension: Secondary | ICD-10-CM | POA: Insufficient documentation

## 2020-04-05 DIAGNOSIS — E43 Unspecified severe protein-calorie malnutrition: Secondary | ICD-10-CM

## 2020-04-05 DIAGNOSIS — D631 Anemia in chronic kidney disease: Secondary | ICD-10-CM

## 2020-04-05 DIAGNOSIS — N184 Chronic kidney disease, stage 4 (severe): Secondary | ICD-10-CM | POA: Diagnosis not present

## 2020-04-05 LAB — COMPREHENSIVE METABOLIC PANEL
ALT: 30 U/L (ref 0–44)
AST: 28 U/L (ref 15–41)
Albumin: 3.3 g/dL — ABNORMAL LOW (ref 3.5–5.0)
Alkaline Phosphatase: 45 U/L (ref 38–126)
Anion gap: 9 (ref 5–15)
BUN: 30 mg/dL — ABNORMAL HIGH (ref 8–23)
CO2: 25 mmol/L (ref 22–32)
Calcium: 9.5 mg/dL (ref 8.9–10.3)
Chloride: 106 mmol/L (ref 98–111)
Creatinine, Ser: 1.32 mg/dL — ABNORMAL HIGH (ref 0.61–1.24)
GFR, Estimated: 51 mL/min — ABNORMAL LOW (ref >60)
Glucose, Bld: 132 mg/dL — ABNORMAL HIGH (ref 70–99)
Potassium: 3.9 mmol/L (ref 3.5–5.1)
Sodium: 140 mmol/L (ref 135–145)
Total Bilirubin: 0.7 mg/dL (ref 0.3–1.2)
Total Protein: 5.8 g/dL — ABNORMAL LOW (ref 6.5–8.1)

## 2020-04-05 NOTE — Assessment & Plan Note (Signed)
Current H/H are 10.8/33.8 with microcytic, hypochromic indices.  Anemia is stable to slightly improved. Iron panel can be performed if anemia progresses.

## 2020-04-05 NOTE — Progress Notes (Signed)
NURSING HOME LOCATION:  [place] NUMBER:144/P    CODE STATUS: Full Code   PCP:  Neale Burly, MD  This is a comprehensive admission note to [facility] performed on this date less than 30 days from date of admission. Included are preadmission medical/surgical history; reconciled medication list; family history; social history and comprehensive review of systems.  Corrections and additions to the records were documented. Comprehensive physical exam was also performed. Additionally a clinical summary was entered for each active diagnosis pertinent to this admission in the Problem List to enhance continuity of care.  HPI: Patient was hospitalized 11/15-11/22/2021, admitted from home with confusion over the preceding 48 hours. The patient had not been able to answer queries from family members.  They did note bruising over the left temple but had not witnessed fall.  Until 11/13 the patient had been living independently and driving.  Initially he was taken to the [facility] ED but transferred to [facility] to Neurology after Garibaldi documented on imaging.CT cns imaging revealed a left posterior temporal intracerebral hemorrhage, hypertensive versus CAA.  MRI revealed acute hematoma in the left temporoparietal lobe with chronic microhemorrhages.  CT angio of the neck and head were unremarkable.  Echo revealed ejection fraction of 60-65%.   Neurology did not recommend antithrombotic therapy because of ICH.The acute event was also associated with dysphagia. During hospitalization he had episodes of agitation and confusion for which Seroquel 25 mg twice a day was initiated.   Course was also complicated by AKI attributed to poor oral hydration.   A1c 6.3%; Metformin was held. LDL was 171; Lipitor 40 mg was initiated.  Electrolyte imbalances were corrected. PT evaluated the patient and recommended SNF placement for rehab.  Past medical and surgical history: Include essential  hypertension, dyslipidemia and diabetes with PVD.He has PMH of "cranial surgery with removal of left earlobe".  Social history: Currently nondrinker;never smoked.  Family history: Noncontributory due to advanced age.   Review of systems:  Could not be completed due to dementia.  When I entered the room the Speech Therapist was attempting an MMSE evaluation.  The patient was unable to give the date, even the year.  He did follow some commands but slowly.  For example he was able to hold up the correct number of digits on each hand but response was delayed.He exhibited rambling responses with confabulation,circumlocution, and perseveration.  For instance when asked how much he had spent at a store if he bought a dozen apples for $3 and a tricycle for 20; his response was "some charge more than others". Word retrieval was poor & he commented "need to see dictionary".  Physical exam:  Pertinent or positive findings: He appears younger than his stated age but appears malnourished and almost cachectic.  There is temporal wasting.  Limbs are thin and atrophic.  Speech is slightly garbled.  Arcus senilis is present.  Anisocoria is present with the right pupil slightly smaller than the left.  He has complete dentures.  Heart sounds are distant.  Breath sounds are decreased.  Pedal pulses are decreased.  He was weak to opposition; this appears slightly worse in the left upper and lower extremities.  General appearance:no acute distress, increased work of breathing is present.   Lymphatic: No lymphadenopathy about the head, neck, axilla. Eyes: No conjunctival inflammation or lid edema is present. There is no scleral icterus. Ears:  External ear exam shows no significant lesions or deformities.   Nose:  External nasal examination shows no deformity or inflammation. Nasal mucosa are pink and moist without lesions, exudates Oral exam: Lips and gums are healthy appearing.There is no oropharyngeal erythema or  exudate. Neck:  No thyromegaly, masses, tenderness noted.    Heart:  No gallop, murmur, click, rub.  Lungs: without wheezes, rhonchi, rales, rubs. Abdomen:  Abdomen is soft and nontender with no organomegaly, hernias, masses. GU: Deferred  Extremities:  No cyanosis, clubbing, edema. Neurologic exam: Balance, Rhomberg, finger to nose testing could not be completed due to clinical state Skin: Warm & dry w/o tenting. No significant lesions or rash.  See clinical summary under each active problem in the Problem List with associated updated therapeutic plan

## 2020-04-05 NOTE — Assessment & Plan Note (Signed)
A1c was 6.3% indicating pre or borderline diabetes.  Metformin will continue to be held.  If renal function deteriorates; consideration will be given to discontinuing the ACE inhibitor.

## 2020-04-05 NOTE — Patient Instructions (Signed)
See assessment and plan under each diagnosis in the problem list and acutely for this visit 

## 2020-04-05 NOTE — Assessment & Plan Note (Signed)
Fasting lipids should be rechecked after 6-8 weeks of moderately high dose atorvastatin.

## 2020-04-05 NOTE — Assessment & Plan Note (Addendum)
Although he was placed on Seroquel while hospitalized for agitation; he is not exhibiting significant abnormal behavior @ present. He is unable to give the date, even the year.  He follows some commands, albeit slowly.  Dementia appears to be severe clinically.

## 2020-04-05 NOTE — Assessment & Plan Note (Signed)
Nutrition consult at SNF with protein supplementation.

## 2020-04-05 NOTE — Assessment & Plan Note (Signed)
Clinically stable @ present; not a candidate for anticoagulant therapy.

## 2020-04-09 ENCOUNTER — Emergency Department (HOSPITAL_COMMUNITY)
Admission: EM | Admit: 2020-04-09 | Discharge: 2020-04-09 | Disposition: A | Discharge status: Home / Self Care | Payer: Medicare Other | Attending: Emergency Medicine | Admitting: Emergency Medicine

## 2020-04-09 ENCOUNTER — Emergency Department (HOSPITAL_COMMUNITY): Payer: Medicare Other

## 2020-04-09 ENCOUNTER — Encounter (HOSPITAL_COMMUNITY): Payer: Self-pay

## 2020-04-09 ENCOUNTER — Inpatient Hospital Stay
Admission: RE | Admit: 2020-04-09 | Discharge: 2020-05-13 | Disposition: E | Payer: Medicare Other | Source: Ambulatory Visit | Attending: Internal Medicine | Admitting: Internal Medicine

## 2020-04-09 DIAGNOSIS — F419 Anxiety disorder, unspecified: Secondary | ICD-10-CM | POA: Diagnosis not present

## 2020-04-09 DIAGNOSIS — I616 Nontraumatic intracerebral hemorrhage, multiple localized: Secondary | ICD-10-CM | POA: Diagnosis not present

## 2020-04-09 DIAGNOSIS — E1122 Type 2 diabetes mellitus with diabetic chronic kidney disease: Secondary | ICD-10-CM | POA: Diagnosis not present

## 2020-04-09 DIAGNOSIS — I129 Hypertensive chronic kidney disease with stage 1 through stage 4 chronic kidney disease, or unspecified chronic kidney disease: Secondary | ICD-10-CM | POA: Insufficient documentation

## 2020-04-09 DIAGNOSIS — F0150 Vascular dementia without behavioral disturbance: Secondary | ICD-10-CM | POA: Insufficient documentation

## 2020-04-09 DIAGNOSIS — I618 Other nontraumatic intracerebral hemorrhage: Secondary | ICD-10-CM | POA: Diagnosis not present

## 2020-04-09 DIAGNOSIS — Z79899 Other long term (current) drug therapy: Secondary | ICD-10-CM | POA: Insufficient documentation

## 2020-04-09 DIAGNOSIS — G9389 Other specified disorders of brain: Secondary | ICD-10-CM | POA: Diagnosis not present

## 2020-04-09 DIAGNOSIS — N184 Chronic kidney disease, stage 4 (severe): Secondary | ICD-10-CM | POA: Insufficient documentation

## 2020-04-09 DIAGNOSIS — G936 Cerebral edema: Secondary | ICD-10-CM | POA: Diagnosis not present

## 2020-04-09 LAB — COMPREHENSIVE METABOLIC PANEL
ALT: 30 U/L (ref 0–44)
AST: 23 U/L (ref 15–41)
Albumin: 3.6 g/dL (ref 3.5–5.0)
Alkaline Phosphatase: 51 U/L (ref 38–126)
Anion gap: 10 (ref 5–15)
BUN: 28 mg/dL — ABNORMAL HIGH (ref 8–23)
CO2: 28 mmol/L (ref 22–32)
Calcium: 9.7 mg/dL (ref 8.9–10.3)
Chloride: 103 mmol/L (ref 98–111)
Creatinine, Ser: 1.23 mg/dL (ref 0.61–1.24)
GFR, Estimated: 56 mL/min — ABNORMAL LOW (ref >60)
Glucose, Bld: 170 mg/dL — ABNORMAL HIGH (ref 70–99)
Potassium: 3.8 mmol/L (ref 3.5–5.1)
Sodium: 141 mmol/L (ref 135–145)
Total Bilirubin: 0.7 mg/dL (ref 0.3–1.2)
Total Protein: 6.3 g/dL — ABNORMAL LOW (ref 6.5–8.1)

## 2020-04-09 LAB — URINALYSIS, ROUTINE W REFLEX MICROSCOPIC
Bilirubin Urine: NEGATIVE
Glucose, UA: NEGATIVE mg/dL
Ketones, ur: NEGATIVE mg/dL
Leukocytes,Ua: NEGATIVE
Nitrite: NEGATIVE
Protein, ur: 30 mg/dL — AB
RBC / HPF: >50 RBC/hpf — ABNORMAL HIGH (ref 0–5)
Specific Gravity, Urine: 1.015 (ref 1.005–1.030)
pH: 6 (ref 5.0–8.0)

## 2020-04-09 LAB — CBC WITH DIFFERENTIAL/PLATELET
Abs Immature Granulocytes: 0.03 10*3/uL (ref 0.00–0.07)
Basophils Absolute: 0 10*3/uL (ref 0.0–0.1)
Basophils Relative: 1 %
Eosinophils Absolute: 0 10*3/uL (ref 0.0–0.5)
Eosinophils Relative: 0 %
HCT: 32.6 % — ABNORMAL LOW (ref 39.0–52.0)
Hemoglobin: 10.2 g/dL — ABNORMAL LOW (ref 13.0–17.0)
Immature Granulocytes: 0 %
Lymphocytes Relative: 5 %
Lymphs Abs: 0.3 10*3/uL — ABNORMAL LOW (ref 0.7–4.0)
MCH: 24.6 pg — ABNORMAL LOW (ref 26.0–34.0)
MCHC: 31.3 g/dL (ref 30.0–36.0)
MCV: 78.7 fL — ABNORMAL LOW (ref 80.0–100.0)
Monocytes Absolute: 0.4 10*3/uL (ref 0.1–1.0)
Monocytes Relative: 6 %
Neutro Abs: 6.3 10*3/uL (ref 1.7–7.7)
Neutrophils Relative %: 88 %
Platelets: 159 10*3/uL (ref 150–400)
RBC: 4.14 MIL/uL — ABNORMAL LOW (ref 4.22–5.81)
RDW: 15.9 % — ABNORMAL HIGH (ref 11.5–15.5)
WBC: 7.2 10*3/uL (ref 4.0–10.5)
nRBC: 0 % (ref 0.0–0.2)

## 2020-04-09 LAB — RAPID URINE DRUG SCREEN, HOSP PERFORMED
Amphetamines: NOT DETECTED
Barbiturates: NOT DETECTED
Benzodiazepines: NOT DETECTED
Cocaine: NOT DETECTED
Opiates: NOT DETECTED
Tetrahydrocannabinol: NOT DETECTED

## 2020-04-09 LAB — ETHANOL: Alcohol, Ethyl (B): <10 mg/dL (ref <10)

## 2020-04-09 MED ORDER — IOHEXOL 300 MG/ML  SOLN
75.0000 mL | Freq: Once | INTRAMUSCULAR | Status: AC | PRN
  Administered 2020-04-09: 75 mL via INTRAVENOUS

## 2020-04-09 MED ORDER — LORAZEPAM 2 MG/ML IJ SOLN
1.0000 mg | Freq: Once | INTRAMUSCULAR | Status: AC
  Administered 2020-04-09: 1 mg via INTRAMUSCULAR
  Filled 2020-04-09: qty 1

## 2020-04-09 MED ORDER — LORAZEPAM 1 MG PO TABS: ORAL_TABLET | ORAL | 0 refills | Status: DC

## 2020-04-09 NOTE — ED Notes (Signed)
Unable to get O2 sat on pt. Pt will not keep on finger and threw across room. Pt gets agitated when trying to place pulse ox on hand.

## 2020-04-09 NOTE — ED Notes (Signed)
In and out cath done. Pt attempting to hit staff and kick staff. Pt calm once finished with procedure.

## 2020-04-09 NOTE — ED Notes (Signed)
Pt daughter at bedside. Pt attempting to kick daughter. EDP notified.

## 2020-04-09 NOTE — ED Notes (Signed)
IV placed in pt. Pt agitated. Pt attempting to bite RN. Unable to get vitals at this time.
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
## 2020-04-09 NOTE — ED Notes (Signed)
Pt back from CT

## 2020-04-09 NOTE — ED Notes (Signed)
Pt transported to CT ?

## 2020-04-09 NOTE — ED Notes (Signed)
Pt resting at this time. CT called to come attempt to get patient for CT.
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
## 2020-04-09 NOTE — Discharge Instructions (Signed)
Give patient Ativan for aggressive behavior and have his doctor see him this week for recheck

## 2020-04-10 ENCOUNTER — Encounter: Payer: Self-pay | Admitting: Adult Health

## 2020-04-10 ENCOUNTER — Other Ambulatory Visit: Payer: Self-pay | Admitting: Adult Health

## 2020-04-10 ENCOUNTER — Non-Acute Institutional Stay (SKILLED_NURSING_FACILITY): Payer: Medicare Other | Admitting: Adult Health

## 2020-04-10 DIAGNOSIS — F0391 Unspecified dementia with behavioral disturbance: Secondary | ICD-10-CM | POA: Diagnosis not present

## 2020-04-10 DIAGNOSIS — F03911 Unspecified dementia, unspecified severity, with agitation: Secondary | ICD-10-CM

## 2020-04-10 DIAGNOSIS — F0151 Vascular dementia with behavioral disturbance: Secondary | ICD-10-CM

## 2020-04-10 DIAGNOSIS — I611 Nontraumatic intracerebral hemorrhage in hemisphere, cortical: Secondary | ICD-10-CM | POA: Diagnosis not present

## 2020-04-10 DIAGNOSIS — F01518 Vascular dementia, unspecified severity, with other behavioral disturbance: Secondary | ICD-10-CM

## 2020-04-10 DIAGNOSIS — F419 Anxiety disorder, unspecified: Secondary | ICD-10-CM

## 2020-04-10 MED ORDER — LORAZEPAM 2 MG/ML PO CONC: 0.5000 mg | Freq: Three times a day (TID) | ORAL | 0 refills | Status: DC
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
## 2020-04-11 ENCOUNTER — Encounter: Payer: Self-pay | Admitting: Adult Health

## 2020-04-11 ENCOUNTER — Non-Acute Institutional Stay (SKILLED_NURSING_FACILITY): Payer: Medicare Other | Admitting: Adult Health

## 2020-04-11 DIAGNOSIS — F03911 Unspecified dementia, unspecified severity, with agitation: Secondary | ICD-10-CM | POA: Insufficient documentation

## 2020-04-11 DIAGNOSIS — N184 Chronic kidney disease, stage 4 (severe): Secondary | ICD-10-CM | POA: Diagnosis not present

## 2020-04-11 DIAGNOSIS — G9340 Encephalopathy, unspecified: Secondary | ICD-10-CM | POA: Diagnosis not present

## 2020-04-11 DIAGNOSIS — F0391 Unspecified dementia with behavioral disturbance: Secondary | ICD-10-CM | POA: Insufficient documentation

## 2020-04-11 DIAGNOSIS — D631 Anemia in chronic kidney disease: Secondary | ICD-10-CM | POA: Diagnosis not present

## 2020-04-11 DIAGNOSIS — R410 Disorientation, unspecified: Secondary | ICD-10-CM

## 2020-04-11 DIAGNOSIS — I611 Nontraumatic intracerebral hemorrhage in hemisphere, cortical: Secondary | ICD-10-CM | POA: Diagnosis not present

## 2020-04-11 DIAGNOSIS — F419 Anxiety disorder, unspecified: Secondary | ICD-10-CM | POA: Insufficient documentation

## 2020-04-11 NOTE — Progress Notes (Signed)
Location:    [place] Room Number: 144/p Place of Service:  SNF (31)   CODE STATUS: Full Code  No Known Allergies  Chief Complaint  Patient presents with  . 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 He continues to be have periods of agitation and aggressive behaviors toward staff members. His appetite is variable. He is sleeping at night.   Past Medical History:  Diagnosis Date  . Aphasia following other nontraumatic intracranial hemorrhage    Per [facility]'s EMR system- [facility]   . Expressive language disorder    Per [facility]'s EMR system- [facility]   . Hemiparesis affecting right side as late effect of cerebrovascular accident [facility])    Per [facility]'s EMR system- [facility]   . Hypertension, essential    Per [facility]'s EMR system- [facility]   . Nontraumatic intracerebral hemorrhage (HCC)    Per [facility]'s EMR system- [facility]   . Type 2 diabetes mellitus with other circulatory complications (HCC)    Per [facility]'s EMR system- [facility]     Past Surgical History:  Procedure Laterality Date  . cranial surgery     with removal of left earlobe     Social History   Socioeconomic History  . Marital status: Married    Spouse name: Not on file  . Number of children: Not on file  . Years of education: Not on file  . Highest education level: Not on file  Occupational History  . Not on file  Tobacco Use  . Smoking status: Never Smoker  . Smokeless tobacco: Never Used  Vaping Use  . Vaping Use: Never used   Substance and Sexual Activity  . Alcohol use: Not Currently  . Drug use: Never  . Sexual activity: Not Currently  Other Topics Concern  . Not on file  Social History Narrative  . Not on file   Social Determinants of Health   Financial Resource Strain:   . Difficulty of Paying Living Expenses: Not on file  Food Insecurity:   . Worried About Charity fundraiser in the Last Year: Not on file  . Ran Out of Food in the Last Year: Not on file  Transportation Needs:   . Lack of Transportation (Medical): Not on file  . Lack of Transportation (Non-Medical): Not on file  Physical Activity:   . Days of Exercise per Week: Not on file  . Minutes of Exercise per Session: Not on file  Stress:   . Feeling of Stress : Not on file  Social Connections:   . Frequency of Communication with Friends and Family: Not on file  . Frequency of Social Gatherings with Friends and Family: Not on file  . Attends Religious Services: Not on file  . Active Member of Clubs or Organizations: Not on file  . Attends Archivist Meetings: Not on file  . Marital Status: Not on file  Intimate Partner Violence:   . Fear of Current or Ex-Partner: Not on file  . Emotionally Abused:  Not on file  . Physically Abused: Not on file  . Sexually Abused: Not on file   Family History  Problem Relation Age of Onset  . Diabetes Mother   . Hypertension Father       VITAL SIGNS BP (!) 148/80   Pulse 80   Temp 98.7 F (37.1 C)   Resp 20   Ht 6' (1.829 m)   Wt 124 lb 12.8 oz (56.6 kg)   SpO2 97%   BMI 16.93 kg/m   Outpatient Encounter Medications as of 04/11/2020  Medication Sig  . amLODipine (NORVASC) 5 MG tablet Take 5 mg by mouth daily.  . benazepril (LOTENSIN) 20 MG tablet Take 20 mg by mouth daily.  . feeding supplement (ENSURE ENLIVE / ENSURE PLUS) LIQD Take 237 mLs by mouth 2 (two) times daily between meals.  . fluticasone (FLONASE) 50 MCG/ACT nasal spray Place 1 spray into both nostrils daily.    Marland Kitchen LORazepam (ATIVAN) 2 MG/ML concentrated solution Take 0.5 mg by mouth every 8 (eight) hours.  Marland Kitchen LORazepam (ATIVAN) 2 MG/ML concentrated solution Take 0.5 mg by mouth 2 (two) times daily as needed. Special Instructions: for breakthrough agitation aggression  . metoprolol tartrate (LOPRESSOR) 25 MG tablet Take 1 tablet (25 mg total) by mouth 2 (two) times daily.  . NON FORMULARY Diet: _____ Regular, ____x__ NAS, _______Consistent Carbohydrate, _______NPO _____Other  . omeprazole (PRILOSEC) 20 MG capsule Take 20 mg by mouth daily.  . QUEtiapine (SEROQUEL) 50 MG tablet Take 50 mg by mouth 2 (two) times daily.  Marland Kitchen senna-docusate (SENOKOT-S) 8.6-50 MG tablet Take 1 tablet by mouth 2 (two) times daily.  . tamsulosin (FLOMAX) 0.4 MG CAPS capsule Take 0.4 mg by mouth daily.  . traZODone (DESYREL) 50 MG tablet Take 50 mg by mouth every evening.   . [DISCONTINUED] atorvastatin (LIPITOR) 40 MG tablet Take 1 tablet (40 mg total) by mouth daily.  . [DISCONTINUED] ferrous sulfate 325 (65 FE) MG EC tablet Take 325 mg by mouth daily.  . [DISCONTINUED] Multiple Vitamins-Minerals (CENTRUM SILVER 50+MEN) TABS Take 1 tablet by mouth daily.  . [DISCONTINUED] QUEtiapine (SEROQUEL) 25 MG tablet Take 1 tablet (25 mg total) by mouth 2 (two) times daily.   No facility-administered encounter medications on file as of 04/11/2020.     SIGNIFICANT DIAGNOSTIC EXAMS  PREVIOUS   03-27-20: chest x-ray:  The heart size and mediastinal contours are within normal limits. Aortic knob calcifications are seen. Both lungs are clear. The visualized skeletal structures are unremarkable.  03-27-20: ct of head:  1. 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 Further evaluation with MRI recommended. No midline shift. 2. Mild chronic microvascular ischemic changes.  03-28-20: 2-d echo:  Left ventricular ejection fraction, by estimation, is 60 to 65%. The  left  ventricle has normal function. Left ventricular endocardial border  not optimally defined to evaluate regional wall motion. There is moderate  asymmetric left ventricular hypertrophy of the basal-septal segment. Left ventricular diastolic  parameters are indeterminate.   03-28-20: ct angio of head and neck:  1. No evidence of vascular lesion or mass underlying the patient's parenchymal hemorrhage. The hematoma is nonprogressive from yesterday. 2. Mild for age atherosclerosis.  03-28-20: mri of brain:  Acute hematoma left temporoparietal lobe unchanged with surrounding edema. 5 additional foci of chronic microhemorrhage in the brain. 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 No acute ischemic infarct. Mild chronic microvascular ischemic changes in the white matter.  03-31-20: ct of head:  1. No significant interval change in size of left posterior temporal intraparenchymal hemorrhage, estimated volume 11 cc. Mildly increased surrounding vasogenic edema without significant regional mass effect or midline shift. No interval subdural or intraventricular extension. 2. No other new acute intracranial abnormality.  NO NEW EXAMS.    LABS REVIEWED PREVIOUS  03-27-20: wbc 6.7; hgb 10.3; hct 32.5; mcv 78.1 plt 190; glucose 104; bun 22; creat 1.29; k+ 3.9; na++ 132; ca 8.9 liver normal albumin 3.9  03-28-20: hgb a1c 6.3; chol 250; ldl 171; trig 108; hdl 57 03-29-20: iron 79; tibc 318; ferritin 35 03-31-20: wbc 4.6; hgb 11.2; hct 34.7; mcv 77.3 plt 228; glucose 97; bun 14; creat 1.33; k+ 3.9; na++ 143; ca 8.9 04-02-20: wbc 4.4; hgb 10.8; hct 33.8; mcv 77.3 plt 189 glucose 94; bun 14; creat 1.25; k+ 5.7; na++ 141; ca 9.0 04-05-20: glucose 132; bun 30; creat 1.32; k+ 3.9 na++ 140; ca 9.5 liver normal albumin 3.3 03/18/2020: wbc 7.2; hgb 10.2; hct 32.6; mcv 78.7 plt 159; glucose 170; bun 28; creat 1.23; k+ 3.8; na++ 141; ca 9.7 liver normal albumin 3.6   NO NEW LABS.   Review of Systems   Unable to perform ROS: Dementia (unable to participate )    Physical Exam Constitutional:      General: He is not in acute distress.    Appearance: He is not diaphoretic.  HENT:     Ears:     Comments: History of cranial surgery with left ear removal  Eyes:     Conjunctiva/sclera: Conjunctivae normal.  Neck:     Thyroid: No thyromegaly.     Vascular: No JVD.  Cardiovascular:     Rate and Rhythm: Normal rate and regular rhythm.  Pulmonary:     Effort: Pulmonary effort is normal. No respiratory distress.     Breath sounds: Normal breath sounds. No wheezing.  Abdominal:     General: Bowel sounds are normal. There is no distension.     Palpations: Abdomen is soft.     Tenderness: There is no abdominal tenderness.  Musculoskeletal:        General: Normal range of motion.     Cervical back: Neck supple.     Right lower leg: No edema.     Left lower leg: No edema.     Comments: Able to move all extremities   Lymphadenopathy:     Cervical: No cervical adenopathy.  Skin:    General: Skin is warm and dry.  Neurological:     Mental Status: He is alert. Mental status is at baseline.       ASSESSMENT/ PLAN:  1. 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   2. Acute delirium/acute encephalopathy: is alert to self only; continues to be agitated and physically aggressive with staff will increase seroquel to 50 mg twice daily and will continue ativan concentrate 0.5 mg every 8 hours; and twice daily as needed.   3. Anemia due to stage IV chronic kidney disease: is stable hgb 10.2; will stop iron to help reduce his pill burden.   PREVIOUS   4. Protein calorie malnutrition unspecified severity: is stable BMI 16.93; albumin 3.6; will continue supplements as directed.   5. Hyperlipidemia associated with type 2 diabetes: is  stable LDL 171 will stop lipitor to help reduce pill burden.   6. Type 2 diabetes mellitus with  stage 4 chronic kidney disease and hypertension: is stable hgb a1c 6.3 metformin was stopped in the hospital due to his renal failure. Is on ace and statin  7. Hypertension associated with stage 4 chronic kidney disease due to type 2 diabetes mellitus: blood pressure is 148/80 will have nursing monitor blood will continue norvasc 5 mg daily lotensin 20 mg daily lopressor 25 mg twice daily; may need to make medication changes in the near future.   8. Chronic non-seasonal allergic rhinitis: is stable will continue flonase daily   9. GERD without esophagitis: is stable will continue prilosec 20 mg daily  10. BPH without urinary obstruction: is stable will continue flomax 0.4 mg daily   11. Chronic constipation: is stable will continue senna s twice daily   12. Chronic insomnia: is stable will continue trazodone 50 mg nightly       MD is aware of resident's narcotic use and is in agreement with current plan of care. We will attempt to wean resident as appropriate.  Ok Edwards NP [facility]  Contact [phone] Monday through Friday 8am- 5pm  After hours call [phone]

## 2020-04-12 DIAGNOSIS — I469 Cardiac arrest, cause unspecified: Secondary | ICD-10-CM | POA: Diagnosis not present

## 2020-04-12 DIAGNOSIS — R4020 Unspecified coma: Secondary | ICD-10-CM | POA: Diagnosis not present

## 2020-04-12 DIAGNOSIS — I499 Cardiac arrhythmia, unspecified: Secondary | ICD-10-CM | POA: Diagnosis not present

## 2020-04-12 DIAGNOSIS — R404 Transient alteration of awareness: Secondary | ICD-10-CM | POA: Diagnosis not present

## 2020-04-12 DEATH — deceased

## 2020-05-02 ENCOUNTER — Inpatient Hospital Stay: Payer: Medicare Other | Admitting: Adult Health

## 2020-05-13 DIAGNOSIS — 419620001 Death: Secondary | SNOMED CT | POA: Diagnosis not present

## 2020-05-13 DEATH — deceased

## 2021-04-11 IMAGING — CT CT HEAD W/O CM
2 of 3 series · 14 of 47 positions shown, 17 images · non-contrast
Comparison: Prior study from 03/28/2020.

CLINICAL DATA: Follow-up examination for intracranial hemorrhage.

EXAM:
CT HEAD WITHOUT CONTRAST
TECHNIQUE: Contiguous axial images were obtained from the base of the skull
through the vertex without intravenous contrast.

[Series 3: head 5.0 h30s · axial · 0.49mm/px · z∈[-118,+7]mm · 11 of 31 slices shown, 14 images]
[im 3/31  brain]
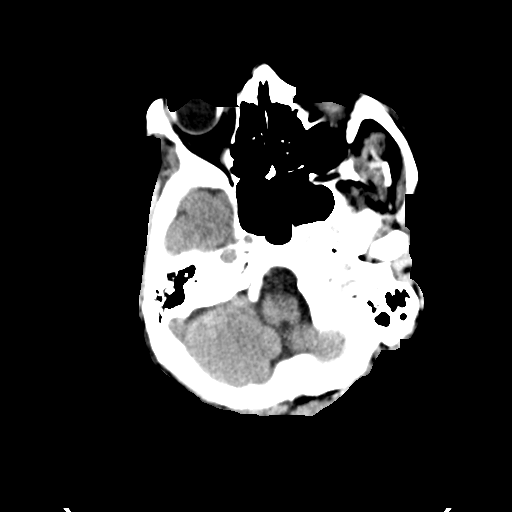
[im 3/31  bone]
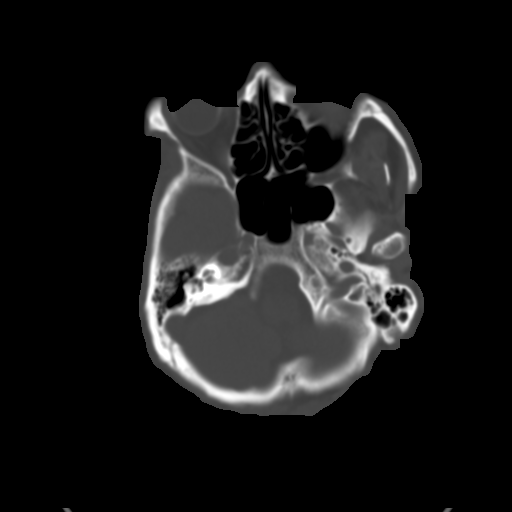
[im 5/31  brain]
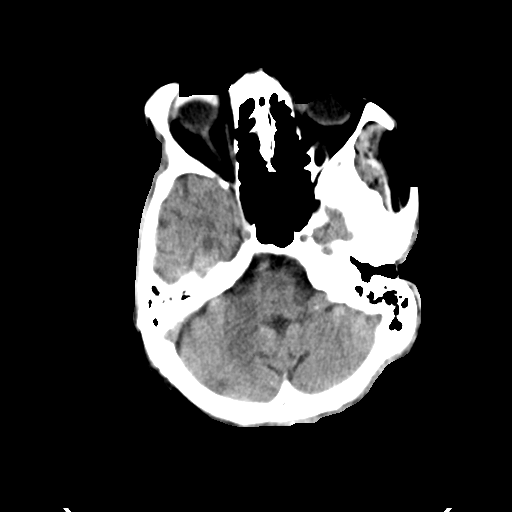
[im 8/31  brain]
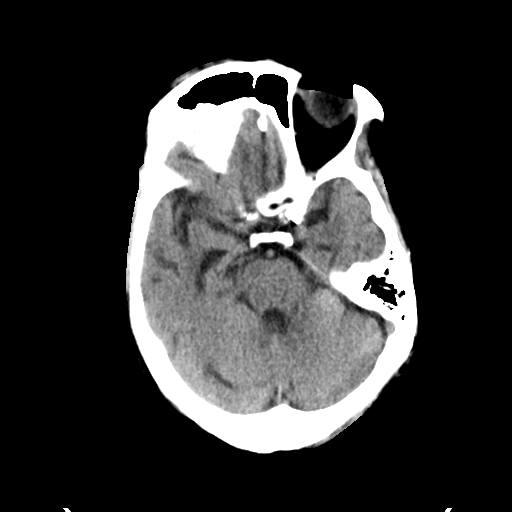
[im 10/31  brain]
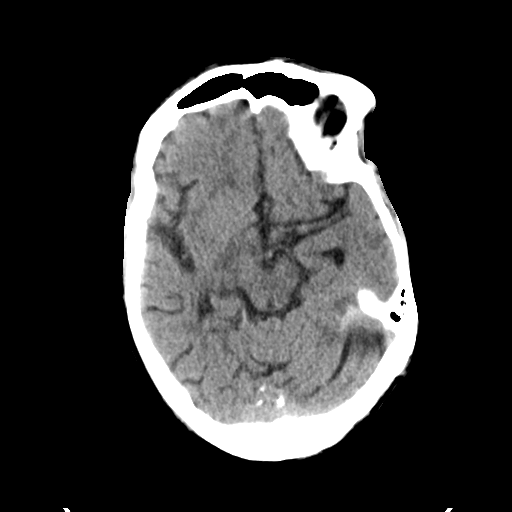
[im 13/31  brain]
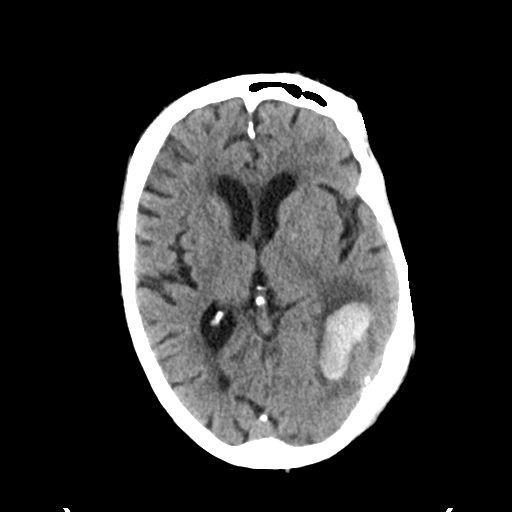
[im 13/31  bone]
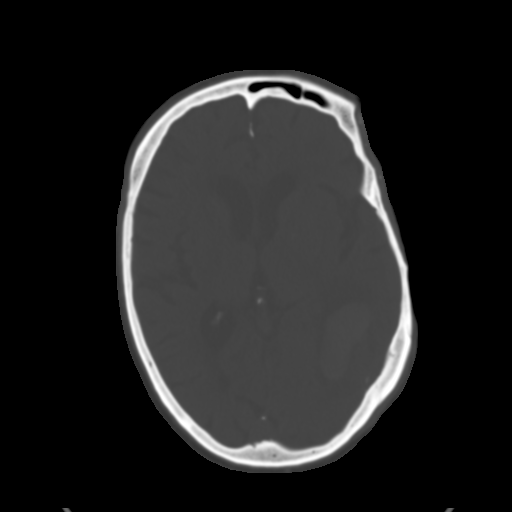
[im 16/31  brain]
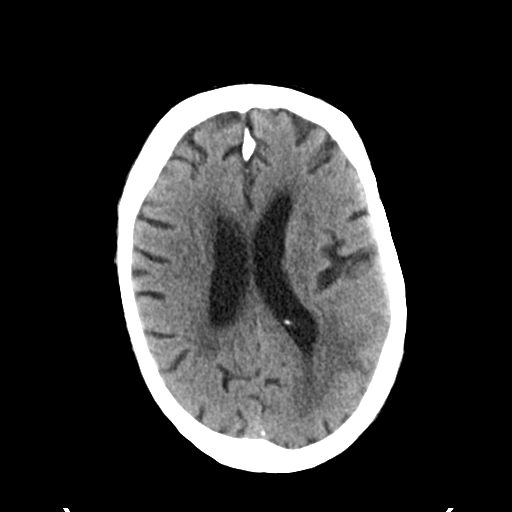
[im 18/31  brain]
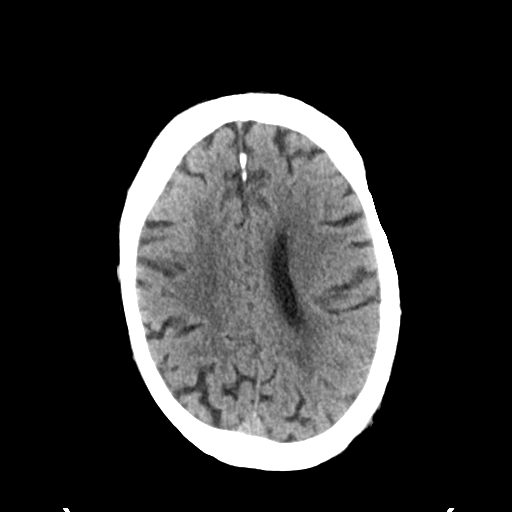
[im 21/31  brain]
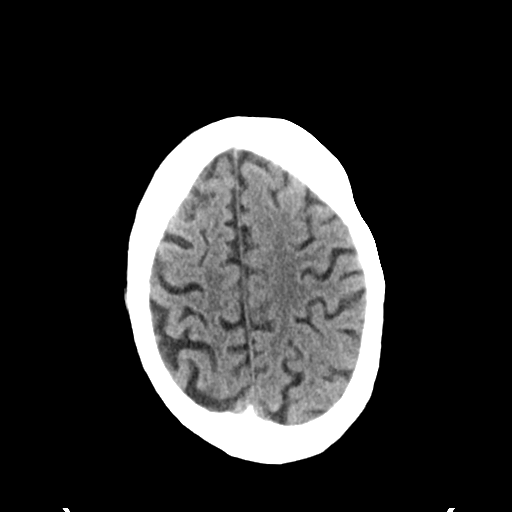
[im 23/31  brain]
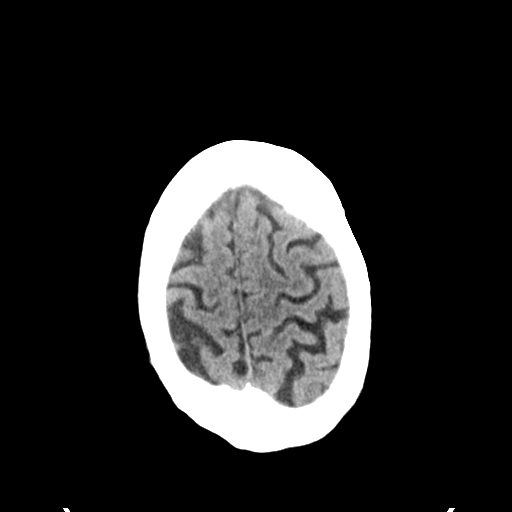
[im 23/31  bone]
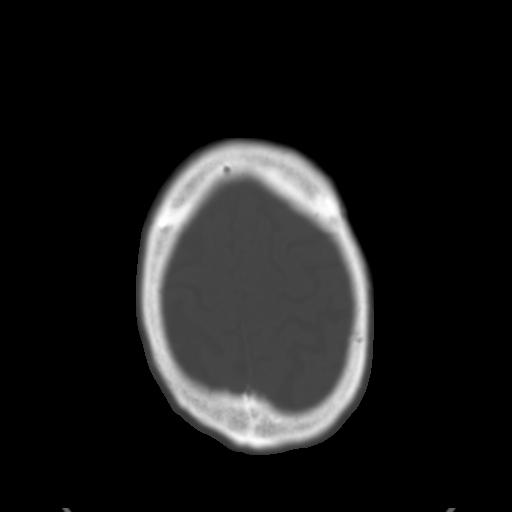
[im 26/31  brain]
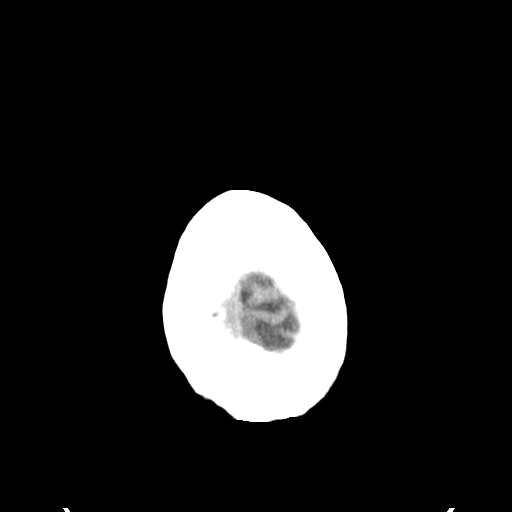
[im 28/31  brain]
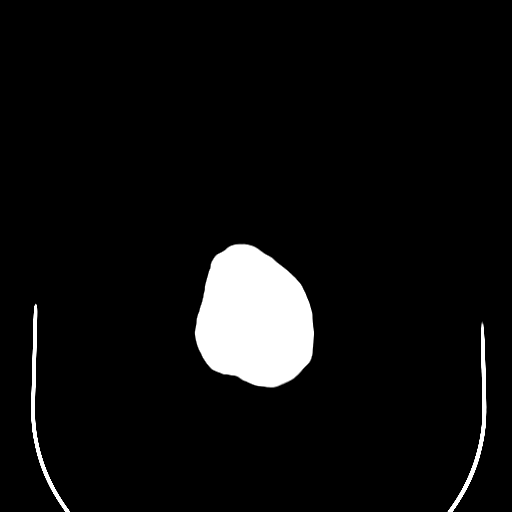

[Series 5: head 3.0 mpr cor · coronal · 0.30mm/px · 3 of 72 slices shown]
[im 24/72  brain]
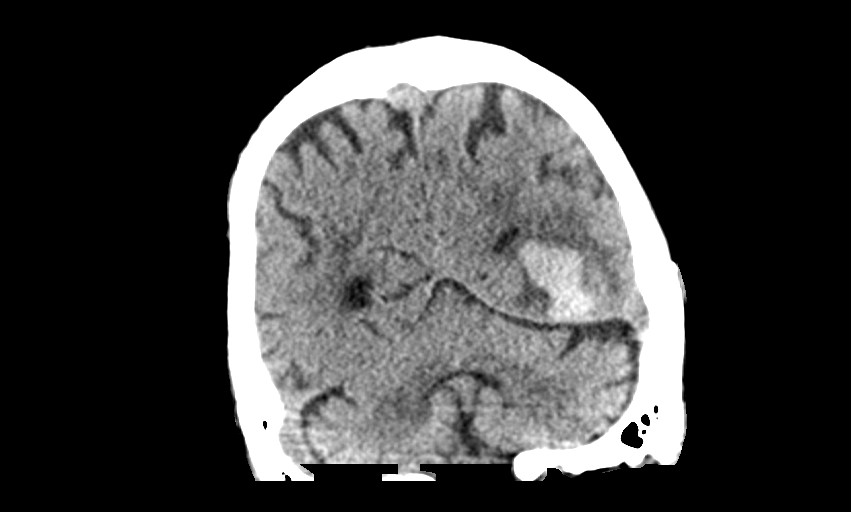
[im 32/72  brain]
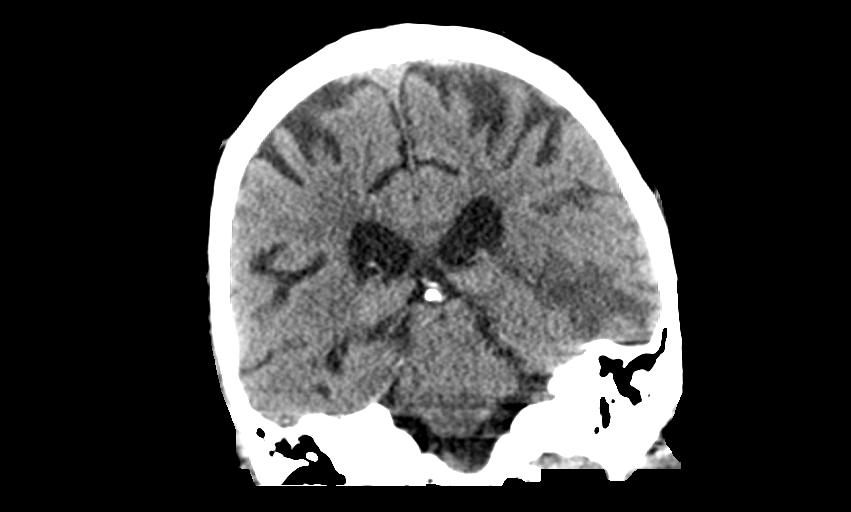
[im 40/72  brain]
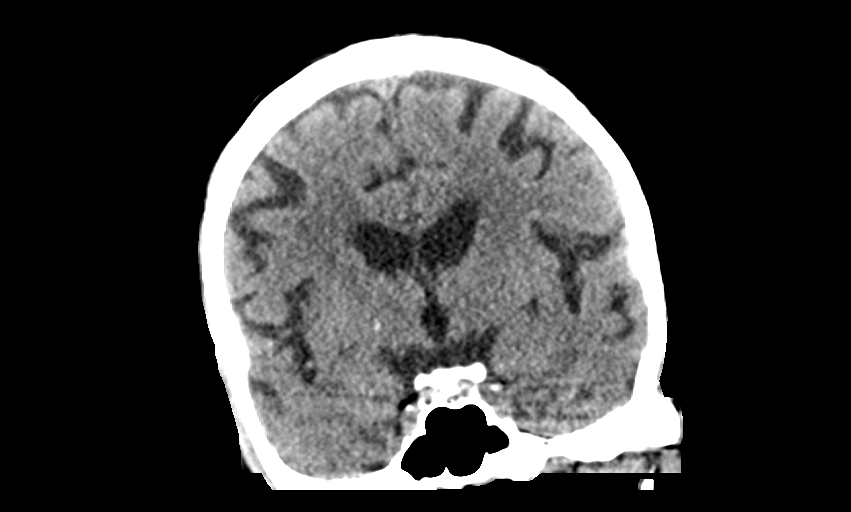

[14 of 47 positions shown; findings below may reference images not displayed]

FINDINGS: Brain: Previously identified left posterior temporal
intraparenchymal hemorrhage is not significantly changed in size
measuring 3.5 x 2.3 x 2.8 cm (estimated volume 11 cc), previously
3.6 x 2.3 x 2.9 cm when measured in a similar fashion. Surrounding
vasogenic edema is mildly increased and more well-defined as
compared to prior. Mild mass effect on the adjacent posterior left
lateral ventricle. No other significant regional mass effect or
midline shift. No intraventricular or subdural extension in the
interim.

No other new acute intracranial hemorrhage. No acute large vessel
territory infarct elsewhere. Atrophy with chronic microvascular
ischemic disease again noted. No hydrocephalus or trapping. No
extra-axial fluid collection.

Vascular: No hyperdense vessel. Calcified atherosclerosis present at
skull base.

Skull: Scalp soft tissues and calvarium demonstrate no new
abnormality.

Sinuses/Orbits: Chronic postoperative changes noted at the right
globe. Globes and orbital soft tissues demonstrate no acute finding.
Visualized paranasal sinuses and mastoid air cells remain clear.

Other: None.
IMPRESSION: 1. No significant interval change in size of left posterior temporal
intraparenchymal hemorrhage, estimated volume 11 cc. Mildly
increased surrounding vasogenic edema without significant regional
mass effect or midline shift. No interval subdural or
intraventricular extension.
2. No other new acute intracranial abnormality.
